# Patient Record
Sex: Female | Born: 1960 | Race: White | Hispanic: No | Marital: Married | Smoking: Current every day smoker
Health system: Southern US, Community
[De-identification: ages and names within clinical notes are randomized; demographics above are authoritative.]

## PROBLEM LIST (undated history)

## (undated) DIAGNOSIS — Z923 Personal history of irradiation: Secondary | ICD-10-CM

## (undated) DIAGNOSIS — J449 Chronic obstructive pulmonary disease, unspecified: Secondary | ICD-10-CM

## (undated) DIAGNOSIS — T7840XA Allergy, unspecified, initial encounter: Secondary | ICD-10-CM

## (undated) HISTORY — DX: Allergy, unspecified, initial encounter: T78.40XA

---

## 2002-06-28 ENCOUNTER — Ambulatory Visit (HOSPITAL_COMMUNITY): Admission: RE | Admit: 2002-06-28 | Discharge: 2002-06-28 | Payer: Self-pay | Admitting: Family Medicine

## 2002-08-02 ENCOUNTER — Other Ambulatory Visit: Admission: RE | Admit: 2002-08-02 | Discharge: 2002-08-02 | Payer: Self-pay

## 2009-01-24 ENCOUNTER — Ambulatory Visit: Payer: Self-pay | Admitting: Cardiology

## 2013-05-27 ENCOUNTER — Emergency Department (HOSPITAL_COMMUNITY)
Admission: EM | Admit: 2013-05-27 | Discharge: 2013-05-27 | Disposition: A | Discharge status: Home / Self Care | Payer: Self-pay | Attending: Emergency Medicine | Admitting: Emergency Medicine

## 2013-05-27 ENCOUNTER — Encounter (HOSPITAL_COMMUNITY): Payer: Self-pay | Admitting: Emergency Medicine

## 2013-05-27 ENCOUNTER — Emergency Department (HOSPITAL_COMMUNITY): Payer: Self-pay

## 2013-05-27 DIAGNOSIS — Z7982 Long term (current) use of aspirin: Secondary | ICD-10-CM | POA: Insufficient documentation

## 2013-05-27 DIAGNOSIS — J4489 Other specified chronic obstructive pulmonary disease: Secondary | ICD-10-CM | POA: Insufficient documentation

## 2013-05-27 DIAGNOSIS — M79671 Pain in right foot: Secondary | ICD-10-CM

## 2013-05-27 DIAGNOSIS — M25579 Pain in unspecified ankle and joints of unspecified foot: Secondary | ICD-10-CM | POA: Insufficient documentation

## 2013-05-27 DIAGNOSIS — Z79899 Other long term (current) drug therapy: Secondary | ICD-10-CM | POA: Insufficient documentation

## 2013-05-27 DIAGNOSIS — F17200 Nicotine dependence, unspecified, uncomplicated: Secondary | ICD-10-CM | POA: Insufficient documentation

## 2013-05-27 DIAGNOSIS — J449 Chronic obstructive pulmonary disease, unspecified: Secondary | ICD-10-CM | POA: Insufficient documentation

## 2013-05-27 HISTORY — DX: Chronic obstructive pulmonary disease, unspecified: J44.9

## 2013-05-27 MED ORDER — NAPROXEN 250 MG PO TABS: 250.0000 mg | ORAL_TABLET | Freq: Two times a day (BID) | ORAL | Status: DC

## 2013-05-27 NOTE — Discharge Instructions (Signed)
°Emergency Department Resource Guide °1) Find a Doctor and Pay Out of Pocket °Although you won't have to find out who is covered by your insurance plan, it is a good idea to ask around and get recommendations. You will then need to call the office and see if the doctor you have chosen will accept you as a new patient and what types of options they offer for patients who are self-pay. Some doctors offer discounts or will set up payment plans for their patients who do not have insurance, but you will need to ask so you aren't surprised when you get to your appointment. ° °2) Contact Your Local Health Department °Not all health departments have doctors that can see patients for sick visits, but many do, so it is worth a call to see if yours does. If you don't know where your local health department is, you can check in your phone book. The CDC also has a tool to help you locate your state's health department, and many state websites also have listings of all of their local health departments. ° °3) Find a Walk-in Clinic °If your illness is not likely to be very severe or complicated, you may want to try a walk in clinic. These are popping up all over the country in pharmacies, drugstores, and shopping centers. They're usually staffed by nurse practitioners or physician assistants that have been trained to treat common illnesses and complaints. They're usually fairly quick and inexpensive. However, if you have serious medical issues or chronic medical problems, these are probably not your best option. ° °No Primary Care Doctor: °- Call [facility] at  832-8000 - they can help you locate a primary care doctor that  accepts your insurance, provides certain services, etc. °- Physician Referral Service- 1-800-533-3463 ° °Chronic Pain Problems: °Organization         Address  Phone   Notes  °[facility]  ([phone]) [phone] Patients need to be referred by their primary care doctor.  ° °Medication  Assistance: °Organization         Address  Phone   Notes  °[place] [facility] [address] °[place], [place] [address] (336) 641-8030 --Must be a resident of [place] °-- Must have NO insurance coverage whatsoever (no Medicaid/ Medicare, etc.) °-- The pt. MUST have a primary care doctor that directs their care regularly and follows them in the community °  °[facility]  (866) 331-1348   °[employer]  (888) 892-1162   ° °Agencies that provide inexpensive medical care: °Organization         Address  Phone   Notes  °[facility]  (336) 832-8035   °[facility]    (336) 832-7272   °[facility] [address] °[place], [place] [address] (336) 832-4777   °[facility] [address], °[place] (336) 271-4999   °[facility]    (336) 373-0678   °[facility]    (336) 272-1050   °[facility] ° [address], [place] Phone:  (336) 832-4444, Fax:  (336) 832-4440 Hours of Operation:  9 am - 6 pm, M-F.  Also accepts Medicaid/Medicare and self-pay.  °[facility] ° [address], [place] Phone: (336) 832-3150, Fax: (336) [phone]. Hours of Operation:  8:30 am - 5:30 pm, M-F.  Also accepts Medicaid and self-pay.  °[facility] [place] [address], [place] Phone: (336) 878-6027   °[facility] [address], [place], [place] (336)723-1848, Ext. 123 Mondays & Thursdays: 7-9 AM.  First 15 patients are seen on a first come, first serve basis. °  ° °Medicaid-accepting [place] Providers: ° °Organization         Address  Phone   Notes  °[facility] [address], [place] (336) 641-2100 Also accepts self-pay patients.  °[facility] [address], [place] ° (336) 856-9996   °[facility] [address], [place]  (336) 288-8857   °[facility] [address] [place] [address], [place] (336) 299-7000   °Veita Bland [address], [place]  ° (336) 373-1557 Only accepts [place] Access Medicaid patients after they have their name applied to their card.  ° °Self-Pay (no insurance) in [place]: ° °Organization         Address  Phone   Notes  °Sickle Cell Patients, [facility] [address], [place] (336) 832-1970   °[facility] [address], [place] (336) 832-4400   °[facility] ° [address], [place] (336) 992-4800   °[facility]/Dr. Osei-Bonsu ° [address] [place] [address], [place] or [address], [place] (336) 841-8500 Phone number for both [place] and [place] locations is the same.  °[facility] [address], [place] (336) 299-0000   °[facility] [address] [place] [address], [place] or [address] (336) 852-7530 °(336) 878-2260   °[facility] [address], [place] (336) 350-1642, phone; (336) 294-5005, fax Sees patients 1st and 3rd Saturday of every month.  Must not qualify for public or private insurance (i.e. Medicaid, Medicare, [place] Health Choice, Veterans' Benefits) • Household income should be no more than 200% of the poverty level •The clinic cannot treat you if you are pregnant or think you are pregnant • Sexually transmitted diseases are not treated at the clinic.  ° ° °Dental Care: °Organization         Address  Phone  Notes  °[place] [facility] [address], [place] (336) 641-6152 Accepts children up to age 21 who are enrolled in Medicaid or [place] Health Choice; pregnant women with a Medicaid card; and children who have applied for Medicaid or [place] Health Choice, but were declined, whose parents can pay a reduced fee at time of service.  °[place]  [facility] [place]  [address], [place] (336) 641-7733 Accepts children up to age 21 who are enrolled in Medicaid or [place] Health Choice; pregnant women with a Medicaid card; and children who have applied for Medicaid or [place] Health Choice, but were declined, whose parents can pay a reduced fee at time of service.  °[facility] ° [address], [place] (336) 641-4533 Patients are seen by appointment only. Walk-ins are not accepted. [facility] will see patients 18 years of age and older. °Monday - Tuesday (8am-5pm) °Most Wednesdays (8:30-5pm) °$30 per visit, cash only  °[facility] ° [address], [place] (336) 641-4533 Patients are seen by appointment only. Walk-ins are not accepted. [facility] will see patients 18 years of age and older. °One   Wednesday Evening (Monthly: Volunteer Based).  $30 per visit, cash only  °[facility]  (919) 537-3737 for adults; Children under age 4, call Graduate Pediatric Dentistry at ([phone]) [phone]. Children aged 4-14, please call ([phone]) [phone] to request a pediatric application. ° Dental services are provided in all areas of dental care including fillings, crowns and bridges, complete and partial dentures, implants, gum treatment, root canals, and extractions. Preventive care is also provided. Treatment is provided to both adults and children. °Patients are selected via a lottery and there is often a waiting list. °  °[facility] [address], °[place] ° (336) 763-8833 [email or web address] °  °[facility] [address], [place], [place] (336)723-1848, Ext. 123 Second and Fourth Thursday of each month, opens at 6:30 AM; Clinic ends at 9 AM.  Patients are seen on a first-come first-served basis, and a limited number are seen during each clinic.  ° °[facility] ° [address], [place], [place] (336) 723-7904    Eligibility Requirements °You must have lived in [place], [place], or [place] counties for at least the last three months. °  You cannot be eligible for state or federal sponsored healthcare insurance, including Veterans Administration, Medicaid, or Medicare. °  You generally cannot be eligible for healthcare insurance through your employer.  °  How to apply: °Eligibility screenings are held every Tuesday and Wednesday afternoon from 1:00 pm until 4:00 pm. You do not need an appointment for the interview!  °[facility] [address], [place], [place] 336-631-2330   °[facility]  336-342-8273   °[facility]  336-703-3100   °[facility]  336-570-6415   ° °Behavioral Health Resources in the Community: °Intensive Outpatient Programs °Organization         Address  Phone  Notes  °[facility] [address], [place], [place] 336-878-6098   °[facility] [address], [place], [place] 336-832-9800   °[facility] [address], [place], [place] ° 336-882-2125   °[facility] [address],  °[place], [place] 1-800-853-5163 or 336-641-4981   °Substance Abuse Resources °Organization         Address  Phone  Notes  °[facility]  336-882-2125   °[facility]  336-784-9470   °[facility]  336-285-9073   °[facility]  336-845-3988   °Residential & Outpatient Substance Abuse Program  1-800-659-3381   °Psychological Services °Organization         Address  Phone  Notes  °[facility]  336- 832-9600   °[facility]  336- 378-7881   °[facility] [address], [place] 1-800-853-5163 or 336-641-4981   ° °Mobile Crisis Teams °Organization         Address  Phone  Notes  °[facility], Mobile Crisis Care Unit  1-877-626-1772   °Assertive °[facility] ° [address]  [place], [place] 336-834-9664   °Sharon DeEsch [address] °[place] 336-554-5454   ° °Self-Help/Support Groups °Organization         Address  Phone             Notes  °[facility]  - variety of support groups  336- 373-1402 Call for more information  °Narcotics Anonymous (NA), [facility] [address], °[place]  2 meetings at this location  ° °  Residential Treatment Programs Organization         Address  Phone  Notes  [facility] [address],    [place]  [phone]   [facility]  [address], [place] [phone], [place], [place] [phone]   [facility] [address], [place] [phone] Admissions: 8am-3pm M-F  [facility] [address]    [place], [place] [phone]) [phone]   [facility] [address], [place], [place] [phone]   [facility] [address]  [place], [place] [phone]) [phone]   [facility] Intensive Outpatient [address], [place], [place] [phone]   [facility] ([facility] [address]  [place], [place] [phone] or [phone]   [facility] ([facility]) [address] [place], [place] [phone] Accepts Medicaid  [facility] [address]  [place] [phone] Substance Abuse/Addiction Treatment   [facility]         Address  Phone  Notes  [facility]  [phone](888) (604)[phone]   Angie FavaJulie Brannon, PhD [address], [address] [place], [place]   [phone](336) ([phone] or [phone](336) ([phone]   [facility]   [address] [place], [place] ([phone](336) ([phone]   [facility] [address], [place], [place] [phone](336) [phone] Insurance/Medicaid/sponsorship through [facility] [address]                                    [place], [place] [phone](336) [phone] Therapy/tele-psych/case    [facility] [address]   [place], [place] [phone]) [phone]    Dr. Lolly MustacheArfeen  [phone](336) [phone]   [facility]  [employer] [facility] 1) [address], [place] 2) [address], [place] 3)  [address], [place] [phone](336) ([phone](336) ([phone]  [phone](336) [phone]   [facility] Child Abuse Hotline [phone](336) [phone] or [phone](336) [phone] (After Hours)       Take the prescription as directed.  Wear well-supportive shoes, with soft cushions or insoles for the next several months.  Frequently perform the stretching exercises, as demonstrated to you in the Emergency Department.  Apply moist heat or ice to the area(s) of discomfort, for 15 minutes at a time, several times per day for the next few days.  Do not fall asleep on a heating or ice pack.  Call your regular medical doctor and the Podatrist today to schedule a follow up appointment within the next week.  Return to the Emergency Department immediately if worsening.

## 2013-05-27 NOTE — ED Notes (Signed)
Pt c/o right foot pain x 1 month with recent swelling.

## 2013-05-27 NOTE — ED Provider Notes (Signed)
CSN: [id number]     Arrival date & time 05/27/13  7846 History   First MD Initiated Contact with Patient 05/27/13 [phone]     Chief Complaint  Patient presents with  . Foot Pain    HPI Pt was seen at 0810. Per pt, c/o gradual onset and persistence of constant right foot "pain" for the past 1 month. Pt states the pain "starts in my toes and goes up to my ankle." States the pain worsens when she stands on her feet at work. Associated with intermittent right lateral ankle and foot "swelling." Denies direct injury. Denies fevers, no focal motor weakness, no tingling/numbness in extremities, no back pain, no abd pain, no rash.    Past Medical History  Diagnosis Date  . COPD (chronic obstructive pulmonary disease)    History reviewed. No pertinent past surgical history.  History  Substance Use Topics  . Smoking status: Current Every Day Smoker -- 1.00 packs/day  . Smokeless tobacco: Not on file  . Alcohol Use: No    Review of Systems ROS: Statement: All systems negative except as marked or noted in the HPI; Constitutional: Negative for fever and chills. ; ; Eyes: Negative for eye pain, redness and discharge. ; ; ENMT: Negative for ear pain, hoarseness, nasal congestion, sinus pressure and sore throat. ; ; Cardiovascular: Negative for chest pain, palpitations, diaphoresis, dyspnea and peripheral edema. ; ; Respiratory: Negative for cough, wheezing and stridor. ; ; Gastrointestinal: Negative for nausea, vomiting, diarrhea, abdominal pain, blood in stool, hematemesis, jaundice and rectal bleeding. . ; ; Genitourinary: Negative for dysuria, flank pain and hematuria. ; ; Musculoskeletal: +right foot pain. Negative for back pain and neck pain. Negative for swelling and trauma.; ; Skin: Negative for pruritus, rash, abrasions, blisters, bruising and skin lesion.; ; Neuro: Negative for headache, lightheadedness and neck stiffness. Negative for weakness, altered level of consciousness , altered mental status,  extremity weakness, paresthesias, involuntary movement, seizure and syncope.       Allergies  Review of patient's allergies indicates no known allergies.  Home Medications   Current Outpatient Rx  Name  Route  Sig  Dispense  Refill  . aspirin EC 81 MG tablet   Oral   Take 81 mg by mouth daily.         . Multiple Vitamins-Minerals (MULTIVITAMINS THER. W/MINERALS) TABS tablet   Oral   Take 1 tablet by mouth daily.         . Omega-3 Fatty Acids (FISH OIL PO)   Oral   Take 1 capsule by mouth daily.         . naproxen (NAPROSYN) 250 MG tablet   Oral   Take 1 tablet (250 mg total) by mouth 2 (two) times daily with a meal.   14 tablet   0    BP 128/84  Pulse 79  Temp(Src) 97.9 F (36.6 C)  Resp 20  Ht 5\' 2"  (1.575 m)  Wt 170 lb (77.111 kg)  BMI 31.09 kg/m2  SpO2 96% Physical Exam 0815: Physical examination:  Nursing notes reviewed; Vital signs and O2 SAT reviewed;  Constitutional: Well developed, Well nourished, Well hydrated, In no acute distress; Head:  Normocephalic, atraumatic; Eyes: EOMI, PERRL, No scleral icterus; ENMT: Mouth and pharynx normal, Mucous membranes moist; Neck: Supple, Full range of motion, No lymphadenopathy; Cardiovascular: Regular rate and rhythm, No murmur, rub, or gallop; Respiratory: Breath sounds clear & equal bilaterally, No rales, rhonchi, wheezes.  Speaking full sentences with ease, Normal respiratory effort/excursion;  Chest: Nontender, Movement normal; Abdomen: Soft, Nontender, Nondistended, Normal bowel sounds; Extremities: Pulses normal, No deformity. No tenderness, No edema, No calf edema or asymmetry.  NMS intact right foot, strong pedal pp, LE muscle compartments soft.  No right proximal fibular head tenderness, no knee tenderness, no ankle tenderness, no foot tenderness.  No deformity, no edema, no ecchymosis, no open wounds, no erythema.  +plantarflexion of right foot w/calf squeeze.  No palpable gap right Achilles's tendon.; Neuro:  AA&Ox3, Major CN grossly intact.  Speech clear. No gross focal motor or sensory deficits in extremities. Climbs on and off stretcher easily by herself. Gait steady.; Skin: Color normal, Warm, Dry.   ED Course  Procedures   EKG Interpretation   None       MDM  MDM Reviewed: previous chart, nursing note and vitals Interpretation: x-ray   Dg Ankle Complete Right 05/27/2013   CLINICAL DATA:  Pain.  EXAM: RIGHT ANKLE - COMPLETE 3+ VIEW  COMPARISON:  None.  FINDINGS: There is no evidence of fracture, dislocation, or joint effusion. There is no evidence of arthropathy or other focal bone abnormality. Soft tissues are unremarkable.  IMPRESSION: Negative.   Electronically Signed   By: Maisie Fushomas  Register   On: 05/27/2013 08:46   Dg Foot Complete Right 05/27/2013   CLINICAL DATA:  Foot pain.  EXAM: RIGHT FOOT COMPLETE - 3+ VIEW  COMPARISON:  None.  FINDINGS: There is no evidence of fracture or dislocation. There is no evidence of arthropathy or other focal bone abnormality. Soft tissues are unremarkable.  IMPRESSION: Negative.   Electronically Signed   By: Maisie Fushomas  Register   On: 05/27/2013 08:45     [id number]:  No acute findings on XR. Will tx symptomatically. Dx and testing d/w pt and family.  Questions answered.  Verb understanding, agreeable to d/c home with outpt f/u.    Laray AngerKathleen M Emerie Vanderkolk, DO 05/29/13 1332

## 2015-02-19 IMAGING — CR DG FOOT COMPLETE 3+V*R*
3 series · 3 of 3 positions shown · non-contrast
Comparison: None.

CLINICAL DATA: Foot pain.

EXAM:
RIGHT FOOT COMPLETE - 3+ VIEW

[view not recorded (1 of 3)]
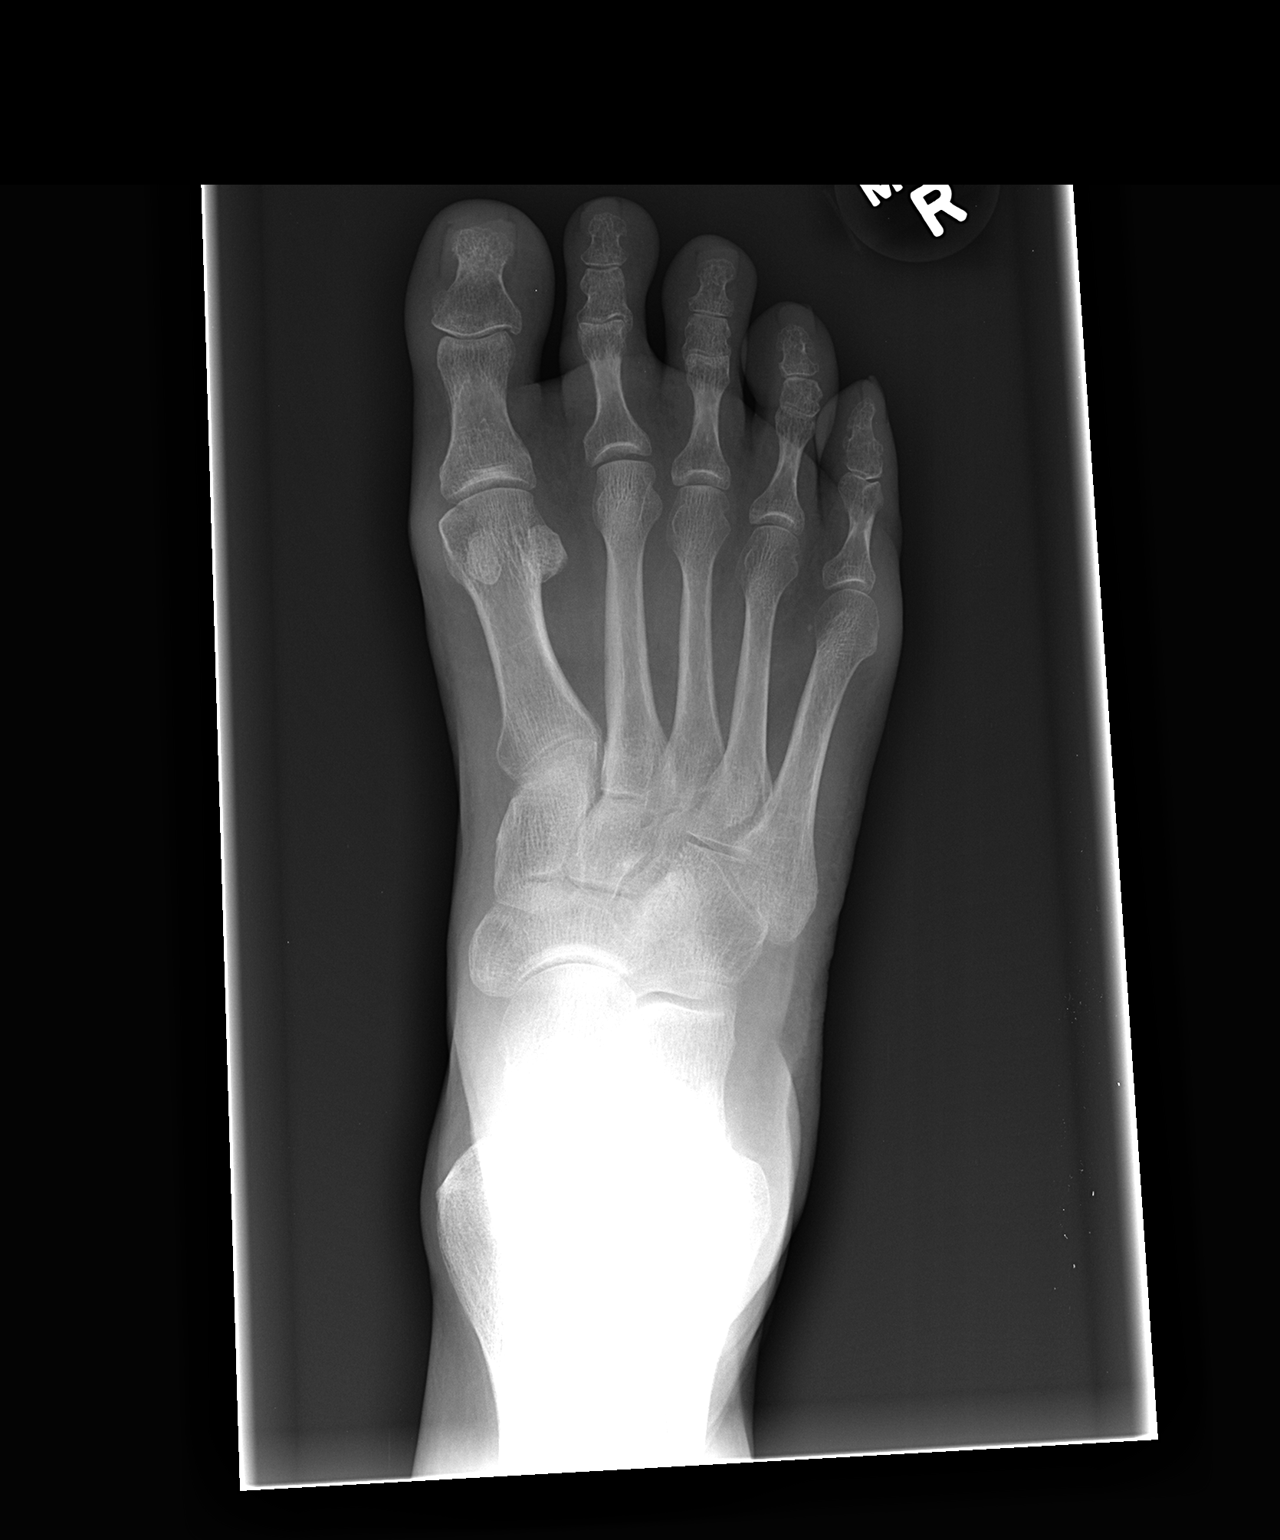

[view not recorded (2 of 3)]
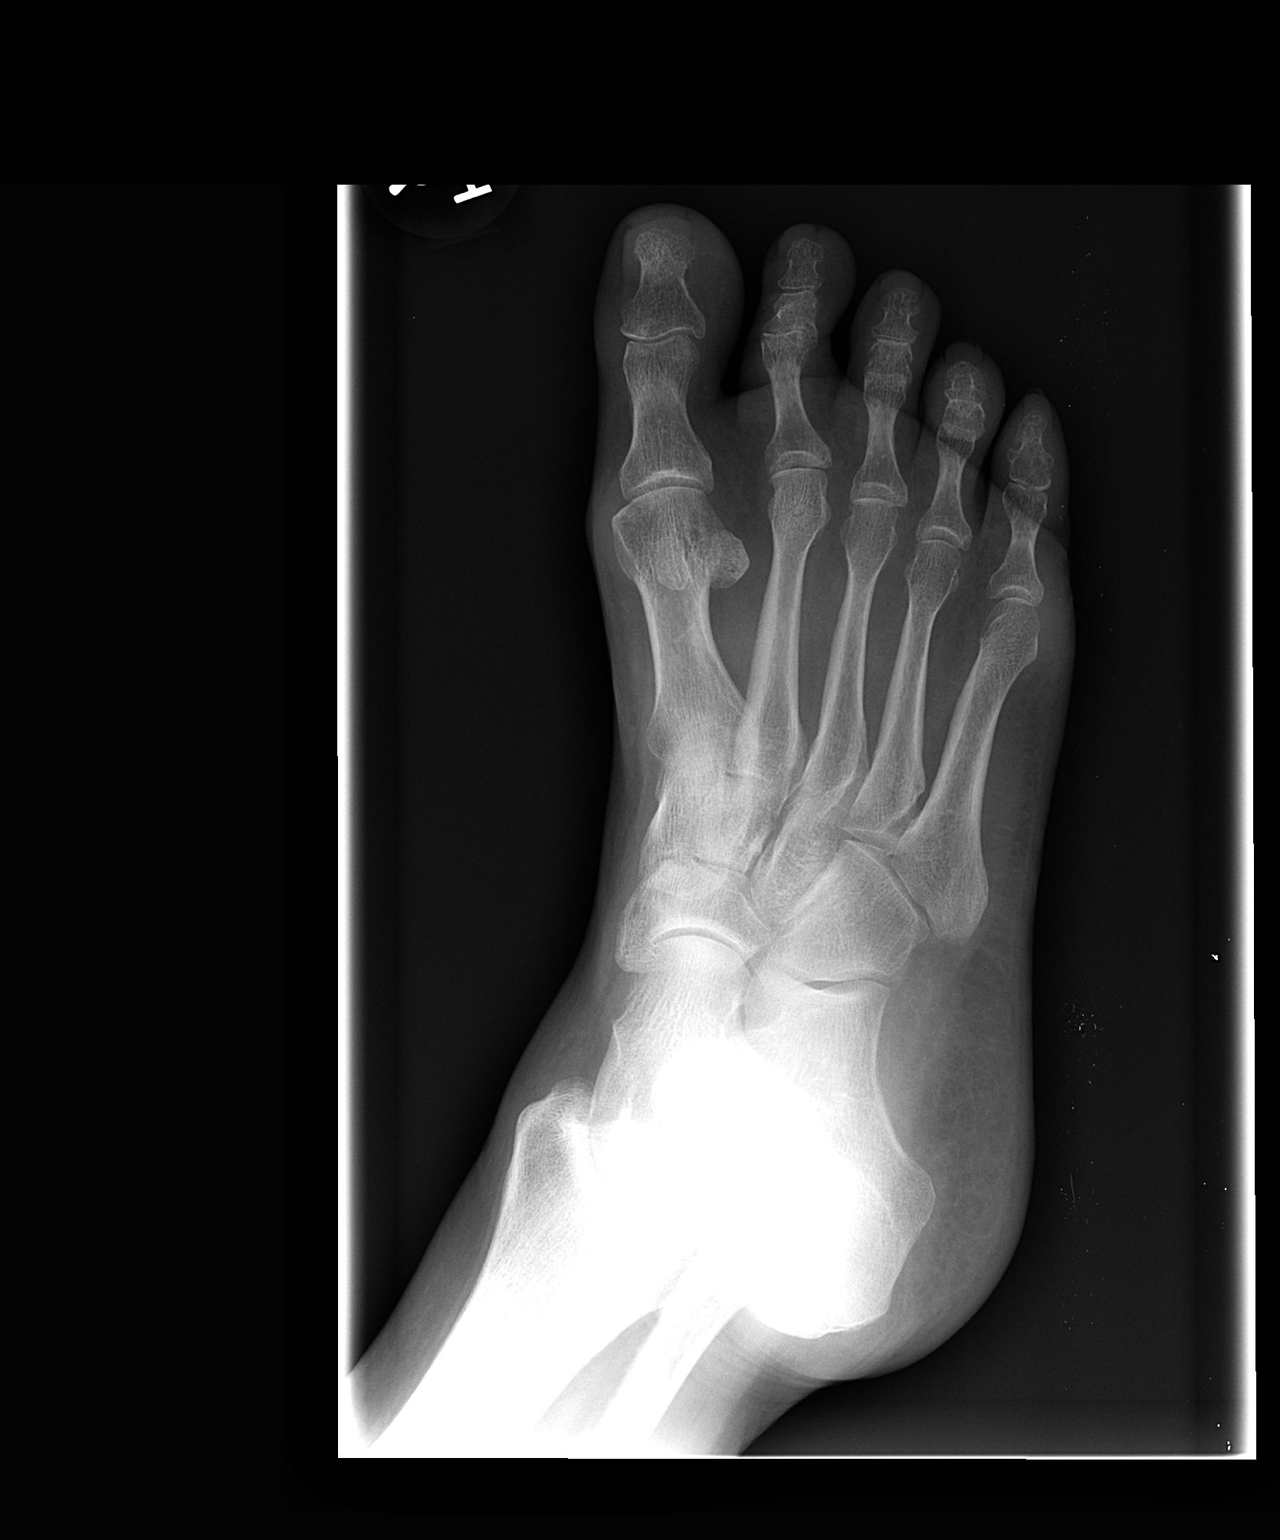

[view not recorded (3 of 3)]
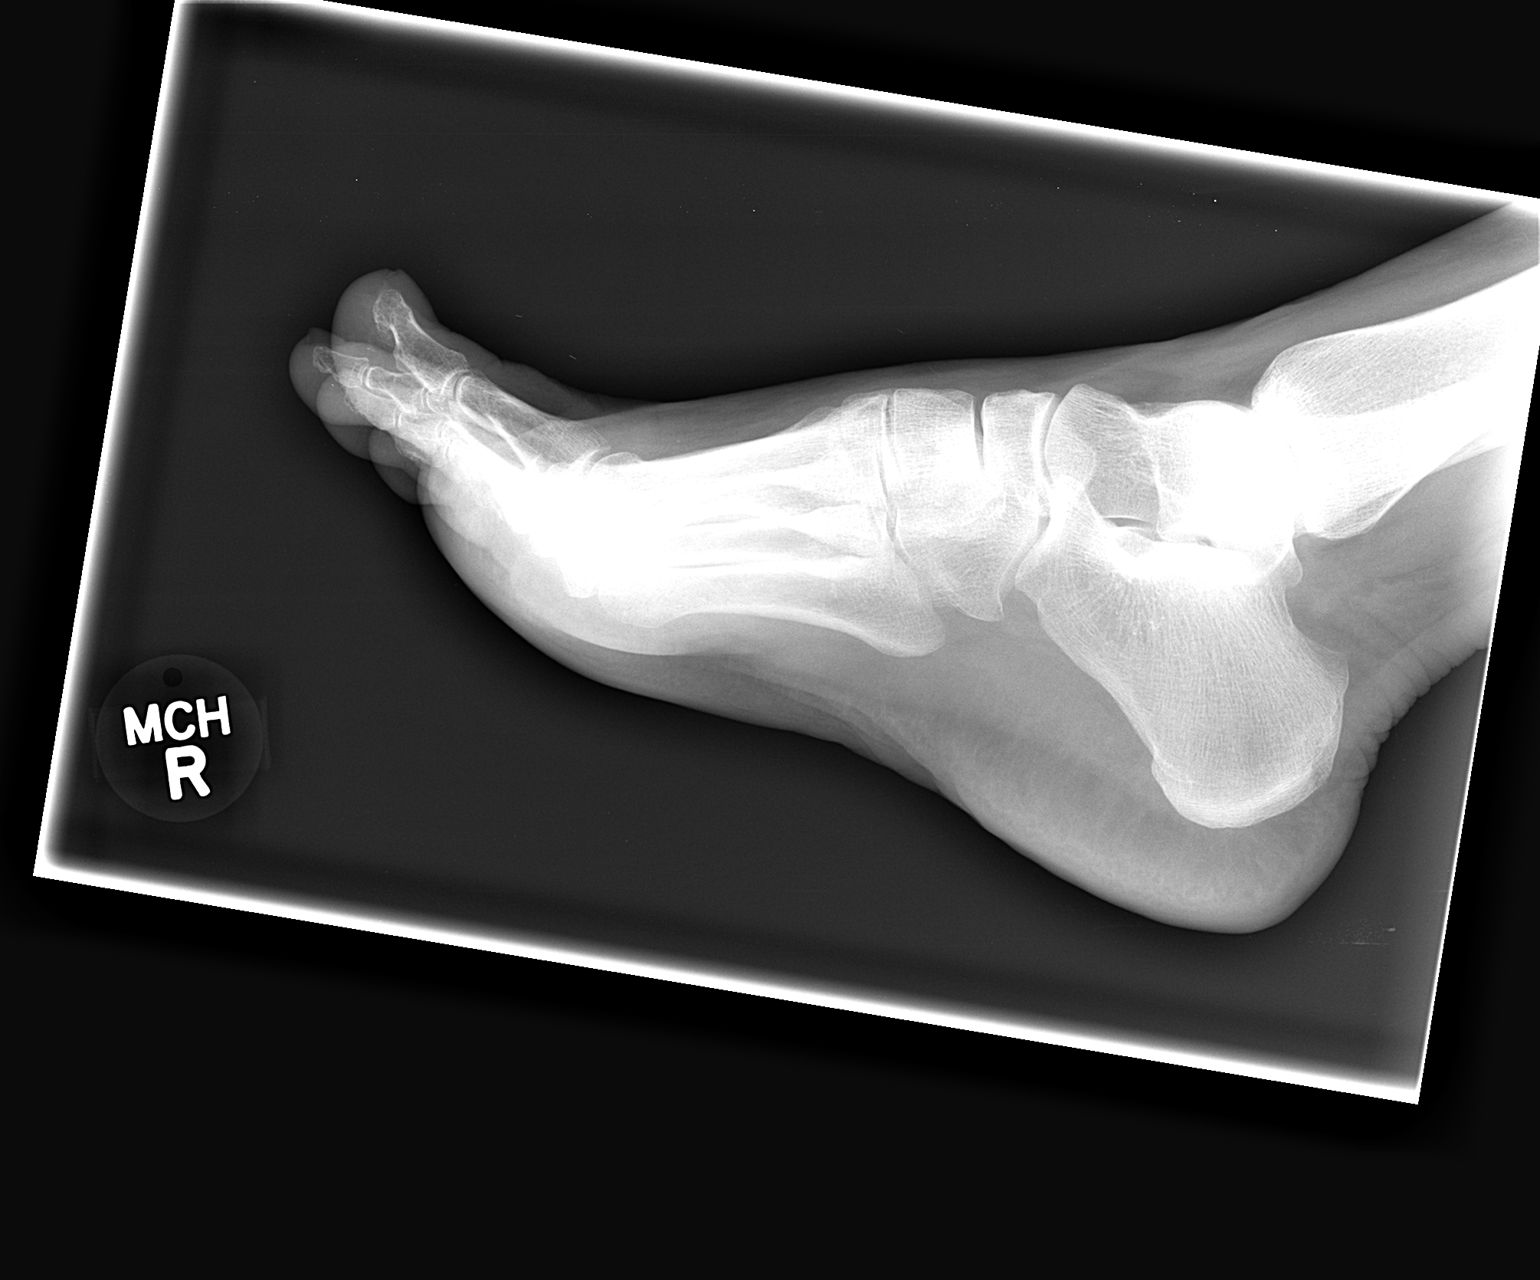

[3 of 3 positions shown; findings below may reference images not displayed]

FINDINGS: There is no evidence of fracture or dislocation. There is no
evidence of arthropathy or other focal bone abnormality. Soft
tissues are unremarkable.
IMPRESSION: Negative.

## 2015-05-06 HISTORY — PX: KNEE SURGERY: SHX244

## 2022-09-03 DIAGNOSIS — R911 Solitary pulmonary nodule: Secondary | ICD-10-CM

## 2022-09-03 HISTORY — DX: Solitary pulmonary nodule: R91.1

## 2023-01-14 NOTE — Progress Notes (Signed)
Clearence Cheek, female    DOB: 04/05/1961    MRN: [id number]   Brief patient profile:  35   yowf  active smoker with chronic cough   referred to pulmonary clinic in [place]  01/15/2023 by UC  for ? Lung mass discovered p syncope episode in [place] in May    Onset of rhinitis/ cough /wheeze age  29s    History of Present Illness  01/15/2023  Pulmonary/ 1st office eval/ Tarra Pence / [facility] on as needed albuterol has both Acupuncturist Complaint  Patient presents with   New Patient (Initial Visit)  Dyspnea:  Not limited by breathing from desired activities   Cough: slt rattle / sometimes yellow  Sleep: bed is flat/ 2 pillows / nose strips  SABA use: twice a week at most  02: none   No obvious day to day or daytime pattern/variability or assoc excess/ purulent sputum or mucus plugs or hemoptysis or cp or chest tightness, subjective wheeze or overt sinus or hb symptoms.    Also denies any obvious fluctuation of symptoms with weather or environmental changes or other aggravating or alleviating factors except as outlined above   No unusual exposure hx or h/o childhood pna/ asthma or knowledge of premature birth.  Current Allergies, Complete Past Medical History, Past Surgical History, Family History, and Social History were reviewed in [employer] record.  ROS  The following are not active complaints unless bolded Hoarseness, sore throat, dysphagia, dental problems, itching, sneezing,  nasal congestion or discharge of excess mucus or purulent secretions, ear ache,   fever, chills, sweats, unintended wt loss or wt gain, classically pleuritic or exertional cp,  orthopnea pnd or arm/hand swelling  or leg swelling, presyncope, palpitations, abdominal pain, anorexia, nausea, vomiting, diarrhea  or change in bowel habits or change in bladder habits, change in stools or change in urine, dysuria, hematuria,  rash, arthralgias, visual complaints, headache,  numbness, weakness or ataxia or problems with walking or coordination,  change in mood or  memory.              Outpatient Medications Prior to Visit  Medication Sig Dispense Refill   albuterol (ACCUNEB) 0.63 MG/3ML nebulizer solution Take 1 ampule by nebulization every 6 (six) hours as needed for wheezing.     albuterol (VENTOLIN HFA) 108 (90 Base) MCG/ACT inhaler Inhale into the lungs every 6 (six) hours as needed for wheezing or shortness of breath. Using at night     aspirin EC 81 MG tablet Take 81 mg by mouth daily.     Multiple Vitamins-Minerals (MULTIVITAMINS THER. W/MINERALS) TABS tablet Take 1 tablet by mouth daily.     naproxen (NAPROSYN) 250 MG tablet Take 1 tablet (250 mg total) by mouth 2 (two) times daily with a meal. 14 tablet 0   Omega-3 Fatty Acids (FISH OIL PO) Take 1 capsule by mouth daily.     No facility-administered medications prior to visit.    Past Medical History:  Diagnosis Date   COPD (chronic obstructive pulmonary disease) (HCC)       Objective:     BP 127/82   Pulse 77   Ht 5\' 2"  (1.575 m)   Wt 170 lb 3.2 oz (77.2 kg)   SpO2 95% Comment: RA  BMI 31.13 kg/m   SpO2: 95 % (RA)  Pleasant amb wf nad    HEENT : Oropharynx  clear      Nasal turbinates nl  NECK :  without  apparent JVD/ palpable Nodes/TM    LUNGS: no acc muscle use,  Nl contour chest  Minimal insp exp rhonchi / mostly upper airway  CV:  RRR  no s3 or murmur or increase in P2, and no edema   ABD:  soft and nontender with nl inspiratory excursion in the supine position. No bruits or organomegaly appreciated   MS:  Nl gait/ ext warm without deformities Or obvious joint restrictions  calf tenderness, cyanosis or clubbing    SKIN: warm and dry without lesions    NEURO:  alert, approp, nl sensorium with  no motor or cerebellar deficits apparent.      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   Will proceed with just the pet to spare her the cost of an unneeded CT chest.   Discussed in detail all the  indications, usual  risks and alternatives  relative to the benefits with patient who agrees to proceed with w/u as outlined.                   Cigarette smoker Counseled re importance of smoking cessation but did not meet time criteria for separate billing      Each maintenance medication was reviewed in detail including emphasizing most importantly the difference between maintenance and prns and under what circumstances the prns are to be triggered using an action plan format where appropriate.  Total time for H and P, chart review, counseling, reviewing hfa device(s) and generating customized AVS unique to this office visit / same day charting = 30 min new pt eval          Sandrea Hughs, MD 01/15/2023

## 2023-01-15 ENCOUNTER — Ambulatory Visit (INDEPENDENT_AMBULATORY_CARE_PROVIDER_SITE_OTHER): Payer: Self-pay | Admitting: Internal Medicine

## 2023-01-15 ENCOUNTER — Encounter: Payer: Self-pay | Admitting: Internal Medicine

## 2023-01-15 VITALS — BP 127/82 | HR 77 | Ht 62.0 in | Wt 170.2 lb

## 2023-01-15 DIAGNOSIS — R911 Solitary pulmonary nodule: Secondary | ICD-10-CM | POA: Insufficient documentation

## 2023-01-15 DIAGNOSIS — F17210 Nicotine dependence, cigarettes, uncomplicated: Secondary | ICD-10-CM

## 2023-01-15 DIAGNOSIS — R918 Other nonspecific abnormal finding of lung field: Secondary | ICD-10-CM

## 2023-01-15 NOTE — Assessment & Plan Note (Signed)
Counseled re importance of smoking cessation but did not meet time criteria for separate billing      Each maintenance medication was reviewed in detail including emphasizing most importantly the difference between maintenance and prns and under what circumstances the prns are to be triggered using an action plan format where appropriate.  Total time for H and P, chart review, counseling, reviewing hfa device(s) and generating customized AVS unique to this office visit / same day charting = 30 min new pt eval

## 2023-01-15 NOTE — Patient Instructions (Addendum)
The key is to stop smoking completely before smoking completely stops you!  Sign up for Obama care insurance as soon as possible     My office will be contacting you by phone for referral to PET Scan at [facility] next available   - if you don't hear back from my office within one week please call us back or notify us thru MyChart and we'll address it right away.    Please remember to go to the  x-ray department  @  [facility] for your tests - we will call you with the results when they are available      No follow up needed at this office  but we can co-ordinate your care from here by phone/ computer

## 2023-01-15 NOTE — Assessment & Plan Note (Addendum)
Active smoker/ seen on incidental portable 09/27/22 @ 2.8 cm diam -   Unfortunately given her smoking hx the size of this asymptomatic nodule (no infection hx) this is most likely bronchogenic ca / staging best done by PET  - since she has no insurance advised she apply for obamacare immediately and [place] if [place] in .   Will proceed with just the pet to spare her the cost of an unneeded CT chest.   Discussed in detail all the  indications, usual  risks and alternatives  relative to the benefits with patient who agrees to proceed with w/u as outlined.

## 2023-01-16 ENCOUNTER — Encounter: Payer: Self-pay | Admitting: Internal Medicine

## 2023-01-29 ENCOUNTER — Encounter (HOSPITAL_COMMUNITY)
Admission: RE | Admit: 2023-01-29 | Discharge: 2023-01-29 | Disposition: A | Discharge status: Home / Self Care | Payer: Self-pay | Source: Ambulatory Visit | Attending: Internal Medicine | Admitting: Internal Medicine

## 2023-01-29 DIAGNOSIS — R918 Other nonspecific abnormal finding of lung field: Secondary | ICD-10-CM | POA: Insufficient documentation

## 2023-02-03 DIAGNOSIS — C801 Malignant (primary) neoplasm, unspecified: Secondary | ICD-10-CM

## 2023-02-03 HISTORY — DX: Malignant (primary) neoplasm, unspecified: C80.1

## 2023-02-05 ENCOUNTER — Encounter (HOSPITAL_COMMUNITY)
Admission: RE | Admit: 2023-02-05 | Discharge: 2023-02-05 | Disposition: A | Discharge status: Home / Self Care | Payer: Self-pay | Source: Ambulatory Visit | Attending: Internal Medicine | Admitting: Internal Medicine

## 2023-02-05 DIAGNOSIS — R918 Other nonspecific abnormal finding of lung field: Secondary | ICD-10-CM | POA: Insufficient documentation

## 2023-02-05 MED ORDER — FLUDEOXYGLUCOSE F - 18 (FDG) INJECTION
8.3500 | Freq: Once | INTRAVENOUS | Status: AC | PRN
  Administered 2023-02-05: 8.35 via INTRAVENOUS

## 2023-02-20 ENCOUNTER — Encounter: Payer: Self-pay | Admitting: Pulmonary Disease

## 2023-02-20 ENCOUNTER — Ambulatory Visit: Payer: Self-pay | Admitting: Pulmonary Disease

## 2023-02-20 VITALS — BP 136/74 | HR 76 | Temp 98.1°F | Ht 63.0 in | Wt 169.2 lb

## 2023-02-20 DIAGNOSIS — R911 Solitary pulmonary nodule: Secondary | ICD-10-CM

## 2023-02-20 DIAGNOSIS — R942 Abnormal results of pulmonary function studies: Secondary | ICD-10-CM

## 2023-02-20 NOTE — Progress Notes (Signed)
Synopsis: Referred in October 2024 for abnormal CT chest abnormal PET scan by No ref. provider found  Subjective:   PATIENT ID: Debra Lutz GENDER: female DOB: 14-Oct-1960, MRN: [id number]  Chief Complaint  Patient presents with   Consult    Review PET 02/2023 and CT chest from 09/2022    This is a 62 year old female longstanding history of tobacco abuse, COPD, longstanding history of cigarettes daily smoker at least a pack per day.  She had CT imaging that was completed at [facility] health care in [place].  That found a lung nodule she was referred to pulmonary seen by Dr. Sherene Sires.Patient had a September 2024 was seen for evaluation and had a nuclear medicine PET scan that was completed.  PET scan completed on 02/05/2023 revealed a hypermetabolic left upper lobe pulmonary mass measuring 3.1 cm suspicious for bronchogenic carcinoma with associated hypermetabolic left hilar adenopathy and AP window adenopathy.  Patient was referred to see me for consideration of bronchoscopy and biopsy.    Past Medical History:  Diagnosis Date   COPD (chronic obstructive pulmonary disease) (HCC)      No family history on file.   No past surgical history on file.  Social History   Socioeconomic History   Marital status: Legally Separated    Spouse name: Not on file   Number of children: Not on file   Years of education: Not on file   Highest education level: Not on file  Occupational History   Not on file  Tobacco Use   Smoking status: Every Day    Current packs/day: 1.00    Types: Cigarettes   Smokeless tobacco: Not on file  Substance and Sexual Activity   Alcohol use: No   Drug use: Yes    Types: Marijuana   Sexual activity: Not on file  Other Topics Concern   Not on file  Social History Narrative   Not on file   Social Determinants of Health   Financial Resource Strain: Not on file  Food Insecurity: Not on file  Transportation Needs: Not on file  Physical Activity: Not on file   Stress: Not on file  Social Connections: Not on file  Intimate Partner Violence: Not on file     Allergies  Allergen Reactions   Wixela Inhub [Fluticasone-Salmeterol] Other (See Comments)    Having tightness in chest ,joint pain , loss of appetite      Outpatient Medications Prior to Visit  Medication Sig Dispense Refill   albuterol (ACCUNEB) 0.63 MG/3ML nebulizer solution Take 1 ampule by nebulization every 6 (six) hours as needed for wheezing.     albuterol (VENTOLIN HFA) 108 (90 Base) MCG/ACT inhaler Inhale into the lungs every 6 (six) hours as needed for wheezing or shortness of breath. Using at night     fexofenadine (ALLEGRA ALLERGY) 180 MG tablet Take 180 mg by mouth daily.     ibuprofen (ADVIL) 200 MG tablet Take 200 mg by mouth every 4 (four) hours as needed for mild pain (pain score 1-3).     Multiple Vitamins-Minerals (MULTIVITAMINS THER. W/MINERALS) TABS tablet Take 1 tablet by mouth daily.     aspirin EC 81 MG tablet Take 81 mg by mouth daily. (Patient not taking: Reported on 02/20/2023)     naproxen (NAPROSYN) 250 MG tablet Take 1 tablet (250 mg total) by mouth 2 (two) times daily with a meal. (Patient not taking: Reported on 02/20/2023) 14 tablet 0   Omega-3 Fatty Acids (FISH OIL PO) Take 1  capsule by mouth daily. (Patient not taking: Reported on 02/20/2023)     No facility-administered medications prior to visit.    Review of Systems  Constitutional:  Negative for chills, fever, malaise/fatigue and weight loss.  HENT:  Negative for hearing loss, sore throat and tinnitus.   Eyes:  Negative for blurred vision and double vision.  Respiratory:  Negative for cough, hemoptysis, sputum production, shortness of breath, wheezing and stridor.   Cardiovascular:  Negative for chest pain, palpitations, orthopnea, leg swelling and PND.  Gastrointestinal:  Negative for abdominal pain, constipation, diarrhea, heartburn, nausea and vomiting.  Genitourinary:  Negative for dysuria,  hematuria and urgency.  Musculoskeletal:  Negative for joint pain and myalgias.  Skin:  Negative for itching and rash.  Neurological:  Negative for dizziness, tingling, weakness and headaches.  Endo/Heme/Allergies:  Negative for environmental allergies. Does not bruise/bleed easily.  Psychiatric/Behavioral:  Negative for depression. The patient is not nervous/anxious and does not have insomnia.   All other systems reviewed and are negative.    Objective:  Physical Exam Vitals reviewed.  Constitutional:      General: She is not in acute distress.    Appearance: She is well-developed.  HENT:     Head: Normocephalic and atraumatic.  Eyes:     General: No scleral icterus.    Conjunctiva/sclera: Conjunctivae normal.     Pupils: Pupils are equal, round, and reactive to light.  Neck:     Vascular: No JVD.     Trachea: No tracheal deviation.  Cardiovascular:     Rate and Rhythm: Normal rate and regular rhythm.     Heart sounds: Normal heart sounds. No murmur heard. Pulmonary:     Effort: Pulmonary effort is normal. No tachypnea, accessory muscle usage or respiratory distress.     Breath sounds: No stridor. No wheezing, rhonchi or rales.  Abdominal:     General: There is no distension.     Palpations: Abdomen is soft.     Tenderness: There is no abdominal tenderness.  Musculoskeletal:        General: No tenderness.     Cervical back: Neck supple.  Lymphadenopathy:     Cervical: No cervical adenopathy.  Skin:    General: Skin is warm and dry.     Capillary Refill: Capillary refill takes less than 2 seconds.     Findings: No rash.  Neurological:     Mental Status: She is alert and oriented to person, place, and time.  Psychiatric:        Behavior: Behavior normal.      Vitals:   02/20/23 1338  BP: 136/74  Pulse: 76  Temp: 98.1 F (36.7 C)  TempSrc: Oral  SpO2: 99%  Weight: 169 lb 3.2 oz (76.7 kg)  Height: 5\' 3"  (1.6 m)   99% on RA BMI Readings from Last 3  Encounters:  02/20/23 29.97 kg/m  01/15/23 31.13 kg/m  05/27/13 31.09 kg/m   Wt Readings from Last 3 Encounters:  02/20/23 169 lb 3.2 oz (76.7 kg)  01/15/23 170 lb 3.2 oz (77.2 kg)  05/27/13 170 lb (77.1 kg)     CBC No results found for: "WBC", "RBC", "HGB", "HCT", "PLT", "MCV", "MCH", "MCHC", "RDW", "LYMPHSABS", "MONOABS", "EOSABS", "BASOSABS"   Chest Imaging:  Nuclear medicine pet imaging October 2024: Left upper lobe pulmonary mass 3.1 cm suspicious for bronchogenic carcinoma with associated hilar adenopathy. The patient's images have been independently reviewed by me.    Pulmonary Functions Testing Results:  No data to display          FeNO:   Pathology:   Echocardiogram:   Heart Catheterization:     Assessment & Plan:     ICD-10-CM   1. Lung nodule  R91.1 Procedural/ Surgical Case Request: ROBOTIC ASSISTED NAVIGATIONAL BRONCHOSCOPY, VIDEO BRONCHOSCOPY WITH ENDOBRONCHIAL ULTRASOUND    Ambulatory referral to Pulmonology    CT Super D Chest Wo Contrast    2. Abnormal PET of right lung  R94.2       Discussion:  This 62 year old female found to have a abnormal PET scan concerning for left upper lobe pulmonary mass with associated adenopathy concerning for primary bronchogenic carcinoma with nodal metastasis.  Plan: Today in the office we talked about risk-benefit alternatives moving forward with procedure to consider bronchoscopy and biopsy. We talked of the risk of bleeding and pneumothorax. Tentative bronchoscopy date we on 03/02/2023. Patient is agreeable to proceed.  She is not on any blood thinners antiplatelets or is diabetic.  We appreciate consult.    Current Outpatient Medications:    albuterol (ACCUNEB) 0.63 MG/3ML nebulizer solution, Take 1 ampule by nebulization every 6 (six) hours as needed for wheezing., Disp: , Rfl:    albuterol (VENTOLIN HFA) 108 (90 Base) MCG/ACT inhaler, Inhale into the lungs every 6 (six) hours as needed for  wheezing or shortness of breath. Using at night, Disp: , Rfl:    fexofenadine (ALLEGRA ALLERGY) 180 MG tablet, Take 180 mg by mouth daily., Disp: , Rfl:    ibuprofen (ADVIL) 200 MG tablet, Take 200 mg by mouth every 4 (four) hours as needed for mild pain (pain score 1-3)., Disp: , Rfl:    Multiple Vitamins-Minerals (MULTIVITAMINS THER. W/MINERALS) TABS tablet, Take 1 tablet by mouth daily., Disp: , Rfl:   Josephine Igo, DO [facility] 02/20/2023 1:59 PM

## 2023-02-20 NOTE — Patient Instructions (Signed)
Thank you for visiting Dr. Tonia Brooms at [facility]. Today we recommend the following:  Orders Placed This Encounter  Procedures   Procedural/ Surgical Case Request: ROBOTIC ASSISTED NAVIGATIONAL BRONCHOSCOPY, VIDEO BRONCHOSCOPY WITH ENDOBRONCHIAL ULTRASOUND   CT Super D Chest Wo Contrast   Ambulatory referral to Pulmonology   Return in about 17 days (around 03/09/2023) for with Kandice Robinsons, NP, after Bronchoscopy.    Please do your part to reduce the spread of COVID-19.

## 2023-02-20 NOTE — H&P (View-Only) (Signed)
Synopsis: Referred in October 2024 for abnormal CT chest abnormal PET scan by No ref. provider found  Subjective:   PATIENT ID: Debra Lutz GENDER: female DOB: 14-Oct-1960, MRN: [id number]  Chief Complaint  Patient presents with   Consult    Review PET 02/2023 and CT chest from 09/2022    This is a 62 year old female longstanding history of tobacco abuse, COPD, longstanding history of cigarettes daily smoker at least a pack per day.  She had CT imaging that was completed at [facility] health care in [place].  That found a lung nodule she was referred to pulmonary seen by Dr. Sherene Sires.Patient had a September 2024 was seen for evaluation and had a nuclear medicine PET scan that was completed.  PET scan completed on 02/05/2023 revealed a hypermetabolic left upper lobe pulmonary mass measuring 3.1 cm suspicious for bronchogenic carcinoma with associated hypermetabolic left hilar adenopathy and AP window adenopathy.  Patient was referred to see me for consideration of bronchoscopy and biopsy.    Past Medical History:  Diagnosis Date   COPD (chronic obstructive pulmonary disease) (HCC)      No family history on file.   No past surgical history on file.  Social History   Socioeconomic History   Marital status: Legally Separated    Spouse name: Not on file   Number of children: Not on file   Years of education: Not on file   Highest education level: Not on file  Occupational History   Not on file  Tobacco Use   Smoking status: Every Day    Current packs/day: 1.00    Types: Cigarettes   Smokeless tobacco: Not on file  Substance and Sexual Activity   Alcohol use: No   Drug use: Yes    Types: Marijuana   Sexual activity: Not on file  Other Topics Concern   Not on file  Social History Narrative   Not on file   Social Determinants of Health   Financial Resource Strain: Not on file  Food Insecurity: Not on file  Transportation Needs: Not on file  Physical Activity: Not on file   Stress: Not on file  Social Connections: Not on file  Intimate Partner Violence: Not on file     Allergies  Allergen Reactions   Wixela Inhub [Fluticasone-Salmeterol] Other (See Comments)    Having tightness in chest ,joint pain , loss of appetite      Outpatient Medications Prior to Visit  Medication Sig Dispense Refill   albuterol (ACCUNEB) 0.63 MG/3ML nebulizer solution Take 1 ampule by nebulization every 6 (six) hours as needed for wheezing.     albuterol (VENTOLIN HFA) 108 (90 Base) MCG/ACT inhaler Inhale into the lungs every 6 (six) hours as needed for wheezing or shortness of breath. Using at night     fexofenadine (ALLEGRA ALLERGY) 180 MG tablet Take 180 mg by mouth daily.     ibuprofen (ADVIL) 200 MG tablet Take 200 mg by mouth every 4 (four) hours as needed for mild pain (pain score 1-3).     Multiple Vitamins-Minerals (MULTIVITAMINS THER. W/MINERALS) TABS tablet Take 1 tablet by mouth daily.     aspirin EC 81 MG tablet Take 81 mg by mouth daily. (Patient not taking: Reported on 02/20/2023)     naproxen (NAPROSYN) 250 MG tablet Take 1 tablet (250 mg total) by mouth 2 (two) times daily with a meal. (Patient not taking: Reported on 02/20/2023) 14 tablet 0   Omega-3 Fatty Acids (FISH OIL PO) Take 1  capsule by mouth daily. (Patient not taking: Reported on 02/20/2023)     No facility-administered medications prior to visit.    Review of Systems  Constitutional:  Negative for chills, fever, malaise/fatigue and weight loss.  HENT:  Negative for hearing loss, sore throat and tinnitus.   Eyes:  Negative for blurred vision and double vision.  Respiratory:  Negative for cough, hemoptysis, sputum production, shortness of breath, wheezing and stridor.   Cardiovascular:  Negative for chest pain, palpitations, orthopnea, leg swelling and PND.  Gastrointestinal:  Negative for abdominal pain, constipation, diarrhea, heartburn, nausea and vomiting.  Genitourinary:  Negative for dysuria,  hematuria and urgency.  Musculoskeletal:  Negative for joint pain and myalgias.  Skin:  Negative for itching and rash.  Neurological:  Negative for dizziness, tingling, weakness and headaches.  Endo/Heme/Allergies:  Negative for environmental allergies. Does not bruise/bleed easily.  Psychiatric/Behavioral:  Negative for depression. The patient is not nervous/anxious and does not have insomnia.   All other systems reviewed and are negative.    Objective:  Physical Exam Vitals reviewed.  Constitutional:      General: She is not in acute distress.    Appearance: She is well-developed.  HENT:     Head: Normocephalic and atraumatic.  Eyes:     General: No scleral icterus.    Conjunctiva/sclera: Conjunctivae normal.     Pupils: Pupils are equal, round, and reactive to light.  Neck:     Vascular: No JVD.     Trachea: No tracheal deviation.  Cardiovascular:     Rate and Rhythm: Normal rate and regular rhythm.     Heart sounds: Normal heart sounds. No murmur heard. Pulmonary:     Effort: Pulmonary effort is normal. No tachypnea, accessory muscle usage or respiratory distress.     Breath sounds: No stridor. No wheezing, rhonchi or rales.  Abdominal:     General: There is no distension.     Palpations: Abdomen is soft.     Tenderness: There is no abdominal tenderness.  Musculoskeletal:        General: No tenderness.     Cervical back: Neck supple.  Lymphadenopathy:     Cervical: No cervical adenopathy.  Skin:    General: Skin is warm and dry.     Capillary Refill: Capillary refill takes less than 2 seconds.     Findings: No rash.  Neurological:     Mental Status: She is alert and oriented to person, place, and time.  Psychiatric:        Behavior: Behavior normal.      Vitals:   02/20/23 1338  BP: 136/74  Pulse: 76  Temp: 98.1 F (36.7 C)  TempSrc: Oral  SpO2: 99%  Weight: 169 lb 3.2 oz (76.7 kg)  Height: 5\' 3"  (1.6 m)   99% on RA BMI Readings from Last 3  Encounters:  02/20/23 29.97 kg/m  01/15/23 31.13 kg/m  05/27/13 31.09 kg/m   Wt Readings from Last 3 Encounters:  02/20/23 169 lb 3.2 oz (76.7 kg)  01/15/23 170 lb 3.2 oz (77.2 kg)  05/27/13 170 lb (77.1 kg)     CBC No results found for: "WBC", "RBC", "HGB", "HCT", "PLT", "MCV", "MCH", "MCHC", "RDW", "LYMPHSABS", "MONOABS", "EOSABS", "BASOSABS"   Chest Imaging:  Nuclear medicine pet imaging October 2024: Left upper lobe pulmonary mass 3.1 cm suspicious for bronchogenic carcinoma with associated hilar adenopathy. The patient's images have been independently reviewed by me.    Pulmonary Functions Testing Results:  No data to display          FeNO:   Pathology:   Echocardiogram:   Heart Catheterization:     Assessment & Plan:     ICD-10-CM   1. Lung nodule  R91.1 Procedural/ Surgical Case Request: ROBOTIC ASSISTED NAVIGATIONAL BRONCHOSCOPY, VIDEO BRONCHOSCOPY WITH ENDOBRONCHIAL ULTRASOUND    Ambulatory referral to Pulmonology    CT Super D Chest Wo Contrast    2. Abnormal PET of right lung  R94.2       Discussion:  This 62 year old female found to have a abnormal PET scan concerning for left upper lobe pulmonary mass with associated adenopathy concerning for primary bronchogenic carcinoma with nodal metastasis.  Plan: Today in the office we talked about risk-benefit alternatives moving forward with procedure to consider bronchoscopy and biopsy. We talked of the risk of bleeding and pneumothorax. Tentative bronchoscopy date we on 03/02/2023. Patient is agreeable to proceed.  She is not on any blood thinners antiplatelets or is diabetic.  We appreciate consult.    Current Outpatient Medications:    albuterol (ACCUNEB) 0.63 MG/3ML nebulizer solution, Take 1 ampule by nebulization every 6 (six) hours as needed for wheezing., Disp: , Rfl:    albuterol (VENTOLIN HFA) 108 (90 Base) MCG/ACT inhaler, Inhale into the lungs every 6 (six) hours as needed for  wheezing or shortness of breath. Using at night, Disp: , Rfl:    fexofenadine (ALLEGRA ALLERGY) 180 MG tablet, Take 180 mg by mouth daily., Disp: , Rfl:    ibuprofen (ADVIL) 200 MG tablet, Take 200 mg by mouth every 4 (four) hours as needed for mild pain (pain score 1-3)., Disp: , Rfl:    Multiple Vitamins-Minerals (MULTIVITAMINS THER. W/MINERALS) TABS tablet, Take 1 tablet by mouth daily., Disp: , Rfl:   Josephine Igo, DO [facility] 02/20/2023 1:59 PM

## 2023-02-27 ENCOUNTER — Encounter (HOSPITAL_COMMUNITY): Payer: Self-pay | Admitting: Pulmonary Disease

## 2023-02-27 NOTE — Progress Notes (Signed)
After several attempts to reach patient via phone, unable to connect with the patient.  Left a detailed message on machine with instructions and information for DOS.  PCP - Coral Ceo, FNP Cardiologist - none  Chest Xray - 09/27/22 NM PET  - 02/05/23  EKG - n/a Stress Test - n/a ECHO - n/a Cardiac Cath - n/a  ICD Pacemaker/Loop - n/a  Sleep Study -  n/a  Diabetes - n/a

## 2023-03-02 ENCOUNTER — Ambulatory Visit (HOSPITAL_COMMUNITY): Payer: Self-pay | Admitting: Anesthesiology

## 2023-03-02 ENCOUNTER — Encounter (HOSPITAL_COMMUNITY): Payer: Self-pay | Admitting: Pulmonary Disease

## 2023-03-02 ENCOUNTER — Ambulatory Visit (HOSPITAL_BASED_OUTPATIENT_CLINIC_OR_DEPARTMENT_OTHER): Payer: Self-pay | Admitting: Anesthesiology

## 2023-03-02 ENCOUNTER — Ambulatory Visit (HOSPITAL_COMMUNITY)
Admission: RE | Admit: 2023-03-02 | Discharge: 2023-03-02 | Disposition: A | Discharge status: Home / Self Care | Payer: Self-pay | Attending: Pulmonary Disease | Admitting: Pulmonary Disease

## 2023-03-02 ENCOUNTER — Encounter (HOSPITAL_COMMUNITY)
Admission: RE | Disposition: A | Discharge status: Home / Self Care | Payer: Self-pay | Source: Home / Self Care | Attending: Pulmonary Disease

## 2023-03-02 ENCOUNTER — Ambulatory Visit (HOSPITAL_COMMUNITY): Payer: Self-pay

## 2023-03-02 DIAGNOSIS — Z01818 Encounter for other preprocedural examination: Secondary | ICD-10-CM

## 2023-03-02 DIAGNOSIS — R942 Abnormal results of pulmonary function studies: Secondary | ICD-10-CM | POA: Insufficient documentation

## 2023-03-02 DIAGNOSIS — R911 Solitary pulmonary nodule: Secondary | ICD-10-CM

## 2023-03-02 DIAGNOSIS — J449 Chronic obstructive pulmonary disease, unspecified: Secondary | ICD-10-CM

## 2023-03-02 DIAGNOSIS — X58XXXA Exposure to other specified factors, initial encounter: Secondary | ICD-10-CM | POA: Insufficient documentation

## 2023-03-02 DIAGNOSIS — R599 Enlarged lymph nodes, unspecified: Secondary | ICD-10-CM | POA: Insufficient documentation

## 2023-03-02 DIAGNOSIS — I700 Atherosclerosis of aorta: Secondary | ICD-10-CM | POA: Insufficient documentation

## 2023-03-02 DIAGNOSIS — R846 Abnormal cytological findings in specimens from respiratory organs and thorax: Secondary | ICD-10-CM | POA: Insufficient documentation

## 2023-03-02 DIAGNOSIS — F17210 Nicotine dependence, cigarettes, uncomplicated: Secondary | ICD-10-CM | POA: Insufficient documentation

## 2023-03-02 DIAGNOSIS — S2221XA Fracture of manubrium, initial encounter for closed fracture: Secondary | ICD-10-CM | POA: Insufficient documentation

## 2023-03-02 HISTORY — PX: BRONCHIAL NEEDLE ASPIRATION BIOPSY: SHX5106

## 2023-03-02 HISTORY — PX: VIDEO BRONCHOSCOPY WITH ENDOBRONCHIAL ULTRASOUND: SHX6177

## 2023-03-02 LAB — CBC
HCT: 45 % (ref 36.0–46.0)
Hemoglobin: 15.1 g/dL — ABNORMAL HIGH (ref 12.0–15.0)
MCH: 30 pg (ref 26.0–34.0)
MCHC: 33.6 g/dL (ref 30.0–36.0)
MCV: 89.5 fL (ref 80.0–100.0)
Platelets: 284 10*3/uL (ref 150–400)
RBC: 5.03 MIL/uL (ref 3.87–5.11)
RDW: 12 % (ref 11.5–15.5)
WBC: 6.2 10*3/uL (ref 4.0–10.5)
nRBC: 0 % (ref 0.0–0.2)

## 2023-03-02 SURGERY — BRONCHOSCOPY, WITH EBUS
Anesthesia: General | Laterality: Left

## 2023-03-02 MED ORDER — FENTANYL CITRATE (PF) 250 MCG/5ML IJ SOLN
INTRAMUSCULAR | Status: DC | PRN
  Administered 2023-03-02: 100 ug via INTRAVENOUS

## 2023-03-02 MED ORDER — CHLORHEXIDINE GLUCONATE 0.12 % MT SOLN: 15.0000 mL | Freq: Once | OROMUCOSAL | Status: AC

## 2023-03-02 MED ORDER — CHLORHEXIDINE GLUCONATE 0.12 % MT SOLN
OROMUCOSAL | Status: AC
  Administered 2023-03-02: 15 mL via OROMUCOSAL
  Filled 2023-03-02: qty 15

## 2023-03-02 MED ORDER — PROPOFOL 10 MG/ML IV BOLUS
INTRAVENOUS | Status: DC | PRN
  Administered 2023-03-02: 180 mg via INTRAVENOUS

## 2023-03-02 MED ORDER — ONDANSETRON HCL 4 MG/2ML IJ SOLN
INTRAMUSCULAR | Status: DC | PRN
  Administered 2023-03-02: 4 mg via INTRAVENOUS

## 2023-03-02 MED ORDER — SUGAMMADEX SODIUM 200 MG/2ML IV SOLN
INTRAVENOUS | Status: DC | PRN
  Administered 2023-03-02 (×2): 200 mg via INTRAVENOUS

## 2023-03-02 MED ORDER — LIDOCAINE 2% (20 MG/ML) 5 ML SYRINGE
INTRAMUSCULAR | Status: DC | PRN
  Administered 2023-03-02: 100 mg via INTRAVENOUS

## 2023-03-02 MED ORDER — ROCURONIUM BROMIDE 10 MG/ML (PF) SYRINGE
PREFILLED_SYRINGE | INTRAVENOUS | Status: DC | PRN
  Administered 2023-03-02: 60 mg via INTRAVENOUS

## 2023-03-02 MED ORDER — FENTANYL CITRATE (PF) 100 MCG/2ML IJ SOLN
INTRAMUSCULAR | Status: AC
  Filled 2023-03-02: qty 2

## 2023-03-02 SURGICAL SUPPLY — 29 items
BRUSH CYTOL CELLEBRITY 1.5X140 (MISCELLANEOUS) IMPLANT
CANISTER SUCT 3000ML PPV (MISCELLANEOUS) ×2 IMPLANT
CONT SPEC 4OZ CLIKSEAL STRL BL (MISCELLANEOUS) ×2 IMPLANT
COVER BACK TABLE 60X90IN (DRAPES) ×2 IMPLANT
COVER DOME SNAP 22 D (MISCELLANEOUS) ×2 IMPLANT
FORCEPS BIOP RJ4 1.8 (CUTTING FORCEPS) IMPLANT
GAUZE SPONGE 4X4 12PLY STRL (GAUZE/BANDAGES/DRESSINGS) ×2 IMPLANT
GLOVE BIO SURGEON STRL SZ7.5 (GLOVE) ×2 IMPLANT
GOWN STRL REUS W/ TWL LRG LVL3 (GOWN DISPOSABLE) ×2 IMPLANT
GOWN STRL REUS W/TWL LRG LVL3 (GOWN DISPOSABLE) ×2
KIT CLEAN ENDO COMPLIANCE (KITS) ×4 IMPLANT
KIT TURNOVER KIT B (KITS) ×2 IMPLANT
MARKER SKIN DUAL TIP RULER LAB (MISCELLANEOUS) ×2 IMPLANT
NDL EBUS SONO TIP PENTAX (NEEDLE) ×2 IMPLANT
NEEDLE EBUS SONO TIP PENTAX (NEEDLE) ×2
NS IRRIG 1000ML POUR BTL (IV SOLUTION) ×2 IMPLANT
OIL SILICONE PENTAX (PARTS (SERVICE/REPAIRS)) ×2 IMPLANT
PAD ARMBOARD 7.5X6 YLW CONV (MISCELLANEOUS) ×4 IMPLANT
SOL ANTI FOG 6CC (MISCELLANEOUS) ×2 IMPLANT
SYR 20CC LL (SYRINGE) ×4 IMPLANT
SYR 20ML ECCENTRIC (SYRINGE) ×4 IMPLANT
SYR 50ML SLIP (SYRINGE) IMPLANT
SYR 5ML LUER SLIP (SYRINGE) ×2 IMPLANT
TOWEL OR 17X24 6PK STRL BLUE (TOWEL DISPOSABLE) ×2 IMPLANT
TRAP SPECIMEN MUCOUS 40CC (MISCELLANEOUS) IMPLANT
TUBE CONNECTING 20X1/4 (TUBING) ×4 IMPLANT
UNDERPAD 30X30 (UNDERPADS AND DIAPERS) ×2 IMPLANT
VALVE DISPOSABLE (MISCELLANEOUS) ×2 IMPLANT
WATER STERILE IRR 1000ML POUR (IV SOLUTION) ×2 IMPLANT

## 2023-03-02 NOTE — Anesthesia Preprocedure Evaluation (Signed)
Anesthesia Evaluation  Patient identified by MRN, date of birth, ID band Patient awake    Reviewed: Allergy & Precautions, NPO status , Patient's Chart, lab work & pertinent test results  Airway Mallampati: II       Dental  (+) Partial Upper, Dental Advisory Given   Pulmonary COPD,  COPD inhaler, Current Smoker and Patient abstained from smoking.   Pulmonary exam normal breath sounds clear to auscultation       Cardiovascular negative cardio ROS Normal cardiovascular exam Rhythm:Regular Rate:Normal     Neuro/Psych negative neurological ROS  negative psych ROS   GI/Hepatic Neg liver ROS,GERD  ,,  Endo/Other  Obesity  Renal/GU negative Renal ROS  negative genitourinary   Musculoskeletal negative musculoskeletal ROS (+)    Abdominal   Peds  Hematology negative hematology ROS (+)   Anesthesia Other Findings   Reproductive/Obstetrics                             Anesthesia Physical Anesthesia Plan  ASA: 3  Anesthesia Plan: General   Post-op Pain Management: Minimal or no pain anticipated   Induction: Intravenous  PONV Risk Score and Plan: 3 and Treatment may vary due to age or medical condition, Ondansetron, Dexamethasone, Propofol infusion and TIVA  Airway Management Planned: Oral ETT  Additional Equipment: None  Intra-op Plan:   Post-operative Plan: Extubation in OR  Informed Consent: I have reviewed the patients History and Physical, chart, labs and discussed the procedure including the risks, benefits and alternatives for the proposed anesthesia with the patient or authorized representative who has indicated his/her understanding and acceptance.     Dental advisory given  Plan Discussed with: CRNA and Anesthesiologist  Anesthesia Plan Comments:        Anesthesia Quick Evaluation

## 2023-03-02 NOTE — Anesthesia Postprocedure Evaluation (Signed)
Anesthesia Post Note  Patient: Debra Lutz  Procedure(s) Performed: VIDEO BRONCHOSCOPY WITH ENDOBRONCHIAL ULTRASOUND (Left) BRONCHIAL NEEDLE ASPIRATION BIOPSIES     Patient location during evaluation: PACU Anesthesia Type: General Level of consciousness: awake and alert and oriented Pain management: pain level controlled Vital Signs Assessment: post-procedure vital signs reviewed and stable Respiratory status: spontaneous breathing, nonlabored ventilation and respiratory function stable Cardiovascular status: blood pressure returned to baseline and stable Postop Assessment: no apparent nausea or vomiting Anesthetic complications: no   There were no known notable events for this encounter.  Last Vitals:  Vitals:   03/02/23 1245 03/02/23 1256  BP: 102/62 105/83  Pulse: 78 80  Resp: 18 15  Temp:  36.6 C  SpO2: 92% 93%    Last Pain:  Vitals:   03/02/23 1256  TempSrc:   PainSc: 0-No pain                 Arnulfo Batson A.

## 2023-03-02 NOTE — Anesthesia Procedure Notes (Signed)
Procedure Name: Intubation Date/Time: 03/02/2023 11:30 AM  Performed by: Allyn Kenner, CRNAPre-anesthesia Checklist: Patient identified, Emergency Drugs available, Suction available and Patient being monitored Patient Re-evaluated:Patient Re-evaluated prior to induction Oxygen Delivery Method: Circle System Utilized Preoxygenation: Pre-oxygenation with 100% oxygen Induction Type: IV induction Ventilation: Mask ventilation without difficulty Laryngoscope Size: Mac and 3 Grade View: Grade I Tube type: Oral Tube size: 8.5 mm Number of attempts: 1 Airway Equipment and Method: Stylet and Oral airway Placement Confirmation: ETT inserted through vocal cords under direct vision, positive ETCO2 and breath sounds checked- equal and bilateral Tube secured with: Tape Dental Injury: Teeth and Oropharynx as per pre-operative assessment

## 2023-03-02 NOTE — Transfer of Care (Signed)
Immediate Anesthesia Transfer of Care Note  Patient: Debra Lutz  Procedure(s) Performed: VIDEO BRONCHOSCOPY WITH ENDOBRONCHIAL ULTRASOUND (Left) BRONCHIAL NEEDLE ASPIRATION BIOPSIES  Patient Location: PACU  Anesthesia Type:General  Level of Consciousness: awake, alert , and oriented  Airway & Oxygen Therapy: Patient Spontanous Breathing and Patient connected to face mask oxygen  Post-op Assessment: Report given to RN and Post -op Vital signs reviewed and stable  Post vital signs: Reviewed and stable  Last Vitals:  Vitals Value Taken Time  BP 121/90 03/02/23 1226  Temp    Pulse 95 03/02/23 1227  Resp 25 03/02/23 1227  SpO2 93 % 03/02/23 1227  Vitals shown include unfiled device data.  Last Pain:  Vitals:   03/02/23 0942  TempSrc:   PainSc: 0-No pain         Complications: No notable events documented.

## 2023-03-02 NOTE — Interval H&P Note (Signed)
History and Physical Interval Note:  03/02/2023 11:05 AM  Debra Lutz  has presented today for surgery, with the diagnosis of lung nodule.  The various methods of treatment have been discussed with the patient and family. After consideration of risks, benefits and other options for treatment, the patient has consented to  Procedure(s): ROBOTIC ASSISTED NAVIGATIONAL BRONCHOSCOPY (Left) VIDEO BRONCHOSCOPY WITH ENDOBRONCHIAL ULTRASOUND (Left) as a surgical intervention.  The patient's history has been reviewed, patient examined, no change in status, stable for surgery.  I have reviewed the patient's chart and labs.  Questions were answered to the patient's satisfaction.     Rachel Bo Harel Repetto

## 2023-03-02 NOTE — Op Note (Signed)
Video Bronchoscopy with Endobronchial Ultrasound Procedure Note  Date of Operation: 03/02/2023  Pre-op Diagnosis: Adenopathy   Post-op Diagnosis: Adenopathy   Surgeon: Josephine Igo, DO   Assistants: None   Anesthesia: General endotracheal anesthesia  Operation: Flexible video fiberoptic bronchoscopy with endobronchial ultrasound and biopsies.  Estimated Blood Loss: Minimal  Complications: None   Indications and History: Debra Lutz is a 62 y.o. female with LUL lung mass, and adenopathy.  The risks, benefits, complications, treatment options and expected outcomes were discussed with the patient.  The possibilities of pneumothorax, pneumonia, reaction to medication, pulmonary aspiration, perforation of a viscus, bleeding, failure to diagnose a condition and creating a complication requiring transfusion or operation were discussed with the patient who freely signed the consent.    Description of Procedure: The patient was examined in the preoperative area and history and data from the preprocedure consultation were reviewed. It was deemed appropriate to proceed.  The patient was taken to [facility] endo 3, identified as Debra Lutz and the procedure verified as Flexible Video Fiberoptic Bronchoscopy.  A Time Out was held and the above information confirmed. After being taken to the operating room general anesthesia was initiated and the patient  was orally intubated. The video fiberoptic bronchoscope was introduced via the endotracheal tube and a general inspection was performed which showed normal appearing airways, no endobronchial lesion, LUL apical segment with hyperemic mucosa. The standard scope was then withdrawn and the endobronchial ultrasound was used to identify and characterize the peritracheal, hilar and bronchial lymph nodes. Inspection showed enlarged station 10L. Using real-time ultrasound guidance Wang needle biopsies were take from Station 10L nodes and were sent for cytology. The  patient tolerated the procedure well without apparent complications. There was no significant blood loss. The bronchoscope was withdrawn. Anesthesia was reversed and the patient was taken to the PACU for recovery.   Samples: 1. Wang needle biopsies from 10L node  Plans:  The patient will be discharged from the PACU to home when recovered from anesthesia. We will review the cytology, pathology results with the patient when they become available. Outpatient followup will be with Kandice Robinsons, NP.   Josephine Igo, DO [facility] 03/02/2023 11:59 AM

## 2023-03-02 NOTE — Discharge Instructions (Signed)
Flexible Bronchoscopy, Care After This sheet gives you information about how to care for yourself after your test. Your doctor may also give you more specific instructions. If you have problems or questions, contact your doctor. Follow these instructions at home: Eating and drinking Do not eat or drink anything (not even water) for 2 hours after your test, or until your numbing medicine (local anesthetic) wears off. When your numbness is gone and your cough and gag reflexes have come back, you may: Eat only soft foods. Slowly drink liquids. The day after the test, go back to your normal diet. Driving Do not drive for 24 hours if you were given a medicine to help you relax (sedative). Do not drive or use heavy machinery while taking prescription pain medicine. General instructions  Take over-the-counter and prescription medicines only as told by your doctor. Return to your normal activities as told. Ask what activities are safe for you. Do not use any products that have nicotine or tobacco in them. This includes cigarettes and e-cigarettes. If you need help quitting, ask your doctor. Keep all follow-up visits as told by your doctor. This is important. It is very important if you had a tissue sample (biopsy) taken. Get help right away if: You have shortness of breath that gets worse. You get light-headed. You feel like you are going to pass out (faint). You have chest pain. You cough up: More than a little blood. More blood than before. Summary Do not eat or drink anything (not even water) for 2 hours after your test, or until your numbing medicine wears off. Do not use cigarettes. Do not use e-cigarettes. Get help right away if you have chest pain.  This information is not intended to replace advice given to you by your health care provider. Make sure you discuss any questions you have with your health care provider. Document Released: 02/16/2009 Document Revised: 04/03/2017 Document  Reviewed: 05/09/2016 Elsevier Patient Education  2020 Elsevier Inc.  

## 2023-03-03 ENCOUNTER — Encounter (HOSPITAL_COMMUNITY): Payer: Self-pay | Admitting: Pulmonary Disease

## 2023-03-03 LAB — CYTOLOGY - NON PAP

## 2023-03-09 NOTE — Progress Notes (Signed)
Sg seeing you tomorrow   Thanks,  BLI  Josephine Igo, DO [facility] 03/09/2023 9:48 AM

## 2023-03-10 ENCOUNTER — Encounter: Payer: Self-pay | Admitting: Acute Care

## 2023-03-10 ENCOUNTER — Telehealth (INDEPENDENT_AMBULATORY_CARE_PROVIDER_SITE_OTHER): Payer: Self-pay | Admitting: Acute Care

## 2023-03-10 DIAGNOSIS — Z9889 Other specified postprocedural states: Secondary | ICD-10-CM

## 2023-03-10 DIAGNOSIS — C3490 Malignant neoplasm of unspecified part of unspecified bronchus or lung: Secondary | ICD-10-CM

## 2023-03-10 DIAGNOSIS — F17210 Nicotine dependence, cigarettes, uncomplicated: Secondary | ICD-10-CM

## 2023-03-10 DIAGNOSIS — F17200 Nicotine dependence, unspecified, uncomplicated: Secondary | ICD-10-CM

## 2023-03-10 DIAGNOSIS — J449 Chronic obstructive pulmonary disease, unspecified: Secondary | ICD-10-CM

## 2023-03-10 NOTE — Patient Instructions (Addendum)
It was good to see you today. I am so glad you have done well since your procedure.  You biopsy did show a non small cell lung cancer. We will refer you to both medical oncology and radiation oncology to be evaluated for treatment. Treatment may involve both options.  Continue to work on quitting smoking.  Here are some options that are not nicotine replacement therapy.  You can receive free nicotine replacement therapy ( patches, gum or mints) by calling 1-800-QUIT NOW. Please call so we can get you on the path to becoming  a non-smoker. I know it is hard, but you can do this!  Other options for assistance in smoking cessation ( As covered by your insurance benefits)  Hypnosis for smoking cessation  [employer] [phone]  Acupuncture for smoking cessation  [employer] [phone]    Please call if you need anything. You will get great care at the [facility].  Please contact office for sooner follow up if symptoms do not improve or worsen or seek emergency care

## 2023-03-10 NOTE — Progress Notes (Signed)
Virtual Visit via Video Note  I connected with Debra Lutz on 03/10/23 at  1:30 PM EST by a video enabled telemedicine application and verified that I am speaking with the correct person using two identifiers.  Location: Patient:  At [facility] office, patient accidentally went to wrong location Provider: [address], [place], [place], [address]    I discussed the limitations of evaluation and management by telemedicine and the availability of in person appointments. The patient expressed understanding and agreed to proceed.  Pt. Visit was converted to a virtual video visit as patient presented to the [place] office instead of to the [place] office where her visit was scheduled.   Synopsis 62 year old female current every day smoker with longstanding history of tobacco abuse, COPD, longstanding history of cigarettes daily smoker at least a pack per day. She had CT imaging that was completed at [facility] health care in [place]. That found a lung nodule she was referred to pulmonary seen by Dr. Sherene Sires.Patient had a September 2024 was seen for evaluation and had a nuclear medicine PET scan that was completed. PET scan completed on 02/05/2023 revealed a hypermetabolic left upper lobe pulmonary mass measuring 3.1 cm suspicious for bronchogenic carcinoma with associated hypermetabolic left hilar adenopathy and AP window adenopathy. Patient was referred to see Dr. Tonia Brooms  for consideration of bronchoscopy with  biopsy. She underwent Flexible video fiberoptic bronchoscopy with endobronchial ultrasound and biopsies on 03/02/2023. She is here to review the results of her cytology, and to ensure she has done well post procedure.   History of Present Illness:  Discussed the use of AI scribe software for clinical note transcription with the patient, who gave verbal consent to proceed.  History of Present Illness   The patient, presents for follow up after bronchoscopy with biopsies 03/02/2023.Debra Lutz  Biopsies are positive for non small cell lung cancer. She reports no post-procedure complications such as bleeding, infection, fever, or changes in sputum. She denies any adverse reactions to anesthesia during the procedure.  The patient has a long-standing history of smoking, with multiple unsuccessful attempts at cessation using patches, lozenges, gum, and Wellbutrin. The longest period of abstinence was three weeks. She expresses a desire to quit smoking, particularly in light of the recent cancer diagnosis.  The patient resides in [place], [place], approximately 40 minutes away from the proposed treatment centers in [place] and [place]. She expresses a preference for treatment at [facility] due to similar travel times.   We discussed that I will refer her to medical oncology as well as radiation oncology. She understands she will get a call to get both appointments scheduled. She is interested in knowing her options for treatment. We discussed that treatment has changed and that we will get her in to be seen to treatment can be started.                Observations/Objective: Test Results: Cytology 03/02/2023 FINAL MICROSCOPIC DIAGNOSIS:   A. LYMPH NODE, TARGET 1 11L, FINE NEEDLE ASPIRATION:  - Malignant cells present  - Non-small cell carcinoma  - See comment      COMMENT:   Definitive tumor cells are not identified in the cellblock, precluding  further assessment.  Dr. Tonia Brooms was notified on 03/03/2023.    Assessment and Plan:    Non-Small Cell Lung Carcinoma in a current every day smoker  Diagnosed via bronchoscopy after suspicious lung nodule identified on PET scan. No post-procedure complications reported. -Refer to Medical Oncology in  [place] for treatment planning. -Refer to Radiation Oncology in [place] for potential stereotactic radiation therapy. - Treatment may involve both options.  - You will get a call to schedule both appointments   Tobacco  Use Long-term smoker with unsuccessful quit attempts using nicotine replacement therapy and Wellbutrin. -Provide contact information for hypnosis and acupuncture services for smoking cessation. -Encourage behavioral therapy for smoking cessation.  -Continue to work on quitting smoking. >> smoking cessation counseling x 3 minutes today.  Here are some options that may help You can receive free nicotine replacement therapy ( patches, gum or mints) by calling 1-800-QUIT NOW. Please call so we can get you on the path to becoming  a non-smoker. I know it is hard, but you can do this!  Other options for assistance in smoking cessation ( As covered by your insurance benefits)  Hypnosis for smoking cessation  [employer] [phone]  Acupuncture for smoking cessation  [employer] [phone]      Please call if you need anything. You will get great care at the [facility].  Please contact office for sooner follow up if symptoms do not improve or worsen or seek emergency care    Follow Up Instructions: Referrals placed    I discussed the assessment and treatment plan with the patient. The patient was provided an opportunity to ask questions and all were answered. The patient agreed with the plan and demonstrated an understanding of the instructions.   The patient was advised to call back or seek an in-person evaluation if the symptoms worsen or if the condition fails to improve as anticipated.  I spent 20 minutes dedicated to the care of this patient on the date of this encounter to include pre-visit review of records, face-to-face time with the patient discussing conditions above, post visit ordering of testing, clinical documentation with the electronic health record, making appropriate referrals as documented, and communicating necessary information to the patient's healthcare team.     [name], NP 03/10/2023  1:17 PM

## 2023-03-12 ENCOUNTER — Other Ambulatory Visit: Payer: Self-pay

## 2023-03-12 ENCOUNTER — Telehealth: Payer: Self-pay | Admitting: Radiation Oncology

## 2023-03-12 DIAGNOSIS — R911 Solitary pulmonary nodule: Secondary | ICD-10-CM

## 2023-03-12 NOTE — Telephone Encounter (Signed)
Left message for patient to call back to schedule consult per 11/5 referral.

## 2023-03-13 ENCOUNTER — Telehealth: Payer: Self-pay | Admitting: Radiation Oncology

## 2023-03-13 NOTE — Telephone Encounter (Signed)
 Left message for patient to call back to schedule consult per 11/5 referral.

## 2023-03-16 ENCOUNTER — Telehealth: Payer: Self-pay | Admitting: Radiation Oncology

## 2023-03-16 NOTE — Telephone Encounter (Signed)
 Left message for patient to call back to schedule consult per 11/5 referral.

## 2023-03-16 NOTE — Progress Notes (Signed)
Location of tumor and Histology per Pathology Report: Left  Biopsy:     Past/Anticipated interventions by surgeon, if any: left       Past/Anticipated interventions by medical oncology, if any: Patient to see Dr. Arbutus Ped on 03/19/23     Pain issues, if any:  {:18581} {PAIN DESCRIPTION:21022940}  SAFETY ISSUES: Prior radiation? {:18581} Pacemaker/ICD? {:18581} Possible current pregnancy? no Is the patient on methotrexate? {:18581}  Current Complaints / other details:  ***     ***

## 2023-03-18 ENCOUNTER — Ambulatory Visit
Admission: RE | Admit: 2023-03-18 | Discharge: 2023-03-18 | Disposition: A | Discharge status: Home / Self Care | Payer: Self-pay | Source: Ambulatory Visit | Attending: Radiation Oncology | Admitting: Radiation Oncology

## 2023-03-18 ENCOUNTER — Encounter: Payer: Self-pay | Admitting: Radiation Oncology

## 2023-03-18 VITALS — BP 131/85 | HR 72 | Temp 97.5°F | Resp 20 | Ht 63.0 in | Wt 168.4 lb

## 2023-03-18 DIAGNOSIS — I2510 Atherosclerotic heart disease of native coronary artery without angina pectoris: Secondary | ICD-10-CM | POA: Insufficient documentation

## 2023-03-18 DIAGNOSIS — C3412 Malignant neoplasm of upper lobe, left bronchus or lung: Secondary | ICD-10-CM | POA: Insufficient documentation

## 2023-03-18 DIAGNOSIS — I700 Atherosclerosis of aorta: Secondary | ICD-10-CM | POA: Insufficient documentation

## 2023-03-18 DIAGNOSIS — R911 Solitary pulmonary nodule: Secondary | ICD-10-CM

## 2023-03-18 DIAGNOSIS — F17210 Nicotine dependence, cigarettes, uncomplicated: Secondary | ICD-10-CM | POA: Insufficient documentation

## 2023-03-18 DIAGNOSIS — S2221XA Fracture of manubrium, initial encounter for closed fracture: Secondary | ICD-10-CM | POA: Insufficient documentation

## 2023-03-18 DIAGNOSIS — Z8052 Family history of malignant neoplasm of bladder: Secondary | ICD-10-CM | POA: Insufficient documentation

## 2023-03-18 DIAGNOSIS — J449 Chronic obstructive pulmonary disease, unspecified: Secondary | ICD-10-CM | POA: Insufficient documentation

## 2023-03-18 DIAGNOSIS — Z809 Family history of malignant neoplasm, unspecified: Secondary | ICD-10-CM | POA: Insufficient documentation

## 2023-03-18 DIAGNOSIS — Z79899 Other long term (current) drug therapy: Secondary | ICD-10-CM | POA: Insufficient documentation

## 2023-03-18 NOTE — Progress Notes (Signed)
Radiation Oncology         [phone] ________________________________  Initial Outpatient Consultation  Name: Debra Lutz MRN: [id number]  Date: 03/18/2023  DOB: 1961-02-14  [id number], No Pcp Per  Josephine Igo, DO   REFERRING PHYSICIAN: Josephine Igo, DO  DIAGNOSIS: The primary encounter diagnosis was Nodule of left lung. A diagnosis of Primary cancer of left upper lobe of lung (HCC) was also pertinent to this visit.  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 female with a long standing smoking history. Today, she is accompanied by her daughter. she is seen as a courtesy of Dr. Tonia Brooms for an opinion concerning radiation therapy as part of management for her recently diagnosed left lung cancer.   The patient had a chest x-ray performed earlier this year on 09/27/22 at [facility] in [place] due to an episode of syncope and reports of chest congestion, which showed a 2.8 cm left suprahilar nodule concerning for malignancy.   She was later referred to Dr. Sherene Sires on 01/15/23 for further evaluation. During that visit, she endorsed dyspnea and a cough (sometimes productive).   She accordingly presented for a PET scan on 02/05/23 which showed a hypermetabolic LUL nodule / mass measuring 3.1 cm, concerning for primary bronchogenic carcinoma; hypermetabolic left hilar and AP window lymph nodes, compatible with nodal metastatic disease; and focal nonspecific hypermetabolic activity in the cecum.  She was then referred to Dr. Tonia Brooms and underwent a bronchoscopy on 03/02/23.  FNA of a target 1 11L lymph collected showed findings consistent with non-small cell carcinoma.   She is also scheduled to meet with Dr. Arbutus Ped in consultation later this afternoon to discuss systemic treatment options if indicated.   Other pertinent imaging performed thus far includes a super D  chest CT on 03/02/23 which showed a slight interval increase in size of the dominant LUL mass, consistent with primary bronchogenic carcinoma. The smaller AP window and hilar lymph nodes otherwise showed interval stability, but remain suspicious for metastatic disease.   PREVIOUS RADIATION THERAPY: No  PAST MEDICAL HISTORY:  Past Medical History:  Diagnosis Date   COPD (chronic obstructive pulmonary disease) (HCC)    Lung nodule 09/2022   hypermetabolic left upper lobe    PAST SURGICAL HISTORY: Past Surgical History:  Procedure Laterality Date   BRONCHIAL NEEDLE ASPIRATION BIOPSY  03/02/2023   Procedure: BRONCHIAL NEEDLE ASPIRATION BIOPSIES;  Surgeon: Josephine Igo, DO;  Location: [facility];  Service: Pulmonary;;   VIDEO BRONCHOSCOPY WITH ENDOBRONCHIAL ULTRASOUND Left 03/02/2023   Procedure: VIDEO BRONCHOSCOPY WITH ENDOBRONCHIAL ULTRASOUND;  Surgeon: Josephine Igo, DO;  Location: [facility];  Service: Pulmonary;  Laterality: Left;    FAMILY HISTORY:  Family History  Problem Relation Age of Onset   Bladder Cancer Father    Cancer Paternal Uncle     SOCIAL HISTORY:  Social History   Tobacco Use   Smoking status: Every Day    Current packs/day: 1.00    Types: Cigarettes  Vaping Use   Vaping status: Never Used  Substance Use Topics   Alcohol use: No   Drug use: Yes    Types: Marijuana    Comment: instructed to withhold 1-2 days prior to procedure.    ALLERGIES:  Allergies  Allergen Reactions   Wixela Inhub [Fluticasone-Salmeterol] Other (See Comments)    Having tightness in chest ,joint pain , loss of  appetite     MEDICATIONS:  Current Outpatient Medications  Medication Sig Dispense Refill   albuterol (ACCUNEB) 0.63 MG/3ML nebulizer solution Take 1 ampule by nebulization every 6 (six) hours as needed for wheezing.     albuterol (VENTOLIN HFA) 108 (90 Base) MCG/ACT inhaler Inhale into the lungs every 6 (six) hours as needed for wheezing or shortness of  breath. Using at night     fexofenadine (ALLEGRA ALLERGY) 180 MG tablet Take 180 mg by mouth daily.     ibuprofen (ADVIL) 200 MG tablet Take 200 mg by mouth every 4 (four) hours as needed for mild pain (pain score 1-3).     Multiple Vitamins-Minerals (MULTIVITAMINS THER. W/MINERALS) TABS tablet Take 1 tablet by mouth daily.     No current facility-administered medications for this encounter.    REVIEW OF SYSTEMS:  A 10+ POINT REVIEW OF SYSTEMS WAS OBTAINED including neurology, dermatology, psychiatry, cardiac, respiratory, lymph, extremities, GI, GU, musculoskeletal, constitutional, reproductive, HEENT.  She denies any headache dizziness or blurred vision.  Per discussion with the daughter there has been no unusual behavior.  She denies any pain within the chest area significant cough or hemoptysis.  She has mild dyspnea on exertion   PHYSICAL EXAM:  height is 5\' 3"  (1.6 m) and weight is 168 lb 6.4 oz (76.4 kg). Her temperature is 97.5 F (36.4 C) (abnormal). Her blood pressure is 131/85 and her pulse is 72. Her respiration is 20 and oxygen saturation is 100%.   General: Alert and oriented, in no acute distress HEENT: Head is normocephalic. Extraocular movements are intact. Oropharynx is clear.  Dentures in place. Neck: Neck is supple, no palpable cervical or supraclavicular lymphadenopathy. Heart: Regular in rate and rhythm with no murmurs, rubs, or gallops. Chest: Clear to auscultation bilaterally, with no rhonchi, wheezes, or rales. Abdomen: Soft, nontender, nondistended, with no rigidity or guarding. Extremities: No cyanosis or edema. Lymphatics: see Neck Exam Skin: No concerning lesions. Musculoskeletal: symmetric strength and muscle tone throughout. Neurologic: Cranial nerves II through XII are grossly intact. No obvious focalities. Speech is fluent. Coordination is intact. Psychiatric: Judgment and insight are intact. Affect is appropriate.   ECOG = 1  0 - Asymptomatic (Fully  active, able to carry on all predisease activities without restriction)  1 - Symptomatic but completely ambulatory (Restricted in physically strenuous activity but ambulatory and able to carry out work of a light or sedentary nature. For example, light housework, office work)  2 - Symptomatic, <50% in bed during the day (Ambulatory and capable of all self care but unable to carry out any work activities. Up and about more than 50% of waking hours)  3 - Symptomatic, >50% in bed, but not bedbound (Capable of only limited self-care, confined to bed or chair 50% or more of waking hours)  4 - Bedbound (Completely disabled. Cannot carry on any self-care. Totally confined to bed or chair)  5 - Death   Santiago Glad MM, Creech RH, Tormey DC, et al. [phone]). "Toxicity and response criteria of the [facility] [employer]". Am. Evlyn Clines. Oncol. 5 (6): 649-55  LABORATORY DATA:  Lab Results  Component Value Date   WBC 6.2 03/02/2023   HGB 15.1 (H) 03/02/2023   HCT 45.0 03/02/2023   MCV 89.5 03/02/2023   PLT 284 03/02/2023   No results found for: "NA", "K", "CL", "CO2", "GLUCOSE", "BUN", "CREATININE", "CALCIUM"    RADIOGRAPHY: CT SUPER D CHEST WO CONTRAST  Result Date: 03/02/2023 CLINICAL DATA:  Surgical planning.  Pre-op. Hypermetabolic left upper lobe pulmonary mass on PET-CT, suspicious for bronchogenic carcinoma. * Tracking Code: BO * EXAM: CT CHEST WITHOUT CONTRAST TECHNIQUE: Multidetector CT imaging of the chest was performed using thin slice collimation for electromagnetic bronchoscopy planning purposes, without intravenous contrast. RADIATION DOSE REDUCTION: This exam was performed according to the departmental dose-optimization program which includes automated exposure control, adjustment of the mA and/or kV according to patient size and/or use of iterative reconstruction technique. COMPARISON:  PET-CT 02/05/2023.  No other comparison studies. FINDINGS: Cardiovascular: Mild aortic and coronary  artery atherosclerosis. The heart size is normal. There is no pericardial effusion. Mediastinum/Nodes: 11 mm short axis AP window lymph node on image 50/3 was hypermetabolic on PET-CT.There is also a small hypermetabolic left hilar lymph node on PET-CT, not well seen on this noncontrast examination. No other enlarged mediastinal or axillary lymph nodes identified. The thyroid gland, trachea and esophagus demonstrate no significant findings. Lungs/Pleura: No pleural effusion or pneumothorax. The dominant spiculated left upper lobe mass measures 3.6 x 2.6 cm on image 45/4 (previously 3.3 x 2.5 cm, remeasured). No other suspicious pulmonary nodules. There is mild atelectasis peripheral to the left upper lobe mass and mild central airway thickening. Upper abdomen: No significant findings are seen in the visualized upper abdomen. Musculoskeletal/Chest wall: No chest wall mass or evidence of osseous metastatic disease. Subacute appearing fracture of the sternal manubrium is grossly unchanged from recent PET-CT, although most obvious on the reformatted images. There are degenerative changes within the thoracic spine. Unless specific follow-up recommendations are mentioned in the findings or impression sections, no imaging follow-up of any mentioned incidental findings is recommended. IMPRESSION: 1. Imaging for bronchoscopy planning and guidance. 2. Slight interval enlargement of the dominant spiculated left upper lobe mass, consistent with primary bronchogenic carcinoma. 3. Grossly stable small AP window and hilar lymph nodes, hypermetabolic on PET-CT, suspicious for metastatic disease. 4. No other evidence of metastatic disease. 5. Subacute appearing fracture of the sternal manubrium, grossly unchanged from recent PET-CT. 6.  Aortic Atherosclerosis (ICD10-I70.0). Electronically Signed   By: Carey Bullocks M.D.   On: 03/02/2023 16:19      IMPRESSION: 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 nodal  involvement  Presumptive stage IIIa non-small cell lung cancer.  Brain MRI has been ordered today.  Assuming no evidence of intracranial metastasis, she would be a good candidate for definitive course of radiation along with radiosensitizing chemotherapy.  Today, I talked to the patient and her daughter about the findings and work-up thus far.  We discussed the natural history of non-small cell lung cancer and general treatment, highlighting the role of radiotherapy in the management.  We discussed the available radiation techniques, and focused on the details of logistics and delivery.  We reviewed the anticipated acute and late sequelae associated with radiation in this setting.  The patient was encouraged to ask questions that I answered to the best of my ability.  A patient consent form was discussed and signed.  We retained a copy for our records.  The patient would like to proceed with radiation and will be scheduled for CT simulation.  PLAN: She will meet with Dr. Arbutus Ped in the near future.  She is scheduled for CT simulation early next week with treatments to begin approximately a week later.  Anticipate 6 weeks of radiation therapy along with radiosensitizing chemotherapy.  Treatment plans could change if brain MRI shows metastasis.   60 minutes of total time was spent for this patient  encounter, including preparation, face-to-face counseling with the patient and coordination of care, physical exam, and documentation of the encounter.   ------------------------------------------------  Billie Lade, PhD, MD  This document serves as a record of services personally performed by Antony Blackbird, MD. It was created on his behalf by Neena Rhymes, a trained medical scribe. The creation of this record is based on the scribe's personal observations and the provider's statements to them. This document has been checked and approved by the attending provider.

## 2023-03-19 ENCOUNTER — Inpatient Hospital Stay: Payer: Self-pay | Attending: Internal Medicine

## 2023-03-19 ENCOUNTER — Inpatient Hospital Stay (HOSPITAL_BASED_OUTPATIENT_CLINIC_OR_DEPARTMENT_OTHER): Payer: Self-pay | Admitting: Internal Medicine

## 2023-03-19 ENCOUNTER — Encounter (HOSPITAL_COMMUNITY): Payer: Self-pay

## 2023-03-19 VITALS — BP 115/69 | HR 64 | Temp 98.0°F | Resp 16 | Ht 63.0 in | Wt 169.0 lb

## 2023-03-19 DIAGNOSIS — Z8052 Family history of malignant neoplasm of bladder: Secondary | ICD-10-CM

## 2023-03-19 DIAGNOSIS — R911 Solitary pulmonary nodule: Secondary | ICD-10-CM

## 2023-03-19 DIAGNOSIS — C3412 Malignant neoplasm of upper lobe, left bronchus or lung: Secondary | ICD-10-CM | POA: Insufficient documentation

## 2023-03-19 DIAGNOSIS — F17210 Nicotine dependence, cigarettes, uncomplicated: Secondary | ICD-10-CM | POA: Insufficient documentation

## 2023-03-19 DIAGNOSIS — Z79899 Other long term (current) drug therapy: Secondary | ICD-10-CM | POA: Insufficient documentation

## 2023-03-19 DIAGNOSIS — J449 Chronic obstructive pulmonary disease, unspecified: Secondary | ICD-10-CM

## 2023-03-19 DIAGNOSIS — Z5111 Encounter for antineoplastic chemotherapy: Secondary | ICD-10-CM | POA: Insufficient documentation

## 2023-03-19 LAB — CBC WITH DIFFERENTIAL (CANCER CENTER ONLY)
Abs Immature Granulocytes: 0.02 10*3/uL (ref 0.00–0.07)
Basophils Absolute: 0.1 10*3/uL (ref 0.0–0.1)
Basophils Relative: 1 %
Eosinophils Absolute: 0.1 10*3/uL (ref 0.0–0.5)
Eosinophils Relative: 1 %
HCT: 42.7 % (ref 36.0–46.0)
Hemoglobin: 14.6 g/dL (ref 12.0–15.0)
Immature Granulocytes: 0 %
Lymphocytes Relative: 23 %
Lymphs Abs: 1.5 10*3/uL (ref 0.7–4.0)
MCH: 30.6 pg (ref 26.0–34.0)
MCHC: 34.2 g/dL (ref 30.0–36.0)
MCV: 89.5 fL (ref 80.0–100.0)
Monocytes Absolute: 0.4 10*3/uL (ref 0.1–1.0)
Monocytes Relative: 7 %
Neutro Abs: 4.4 10*3/uL (ref 1.7–7.7)
Neutrophils Relative %: 68 %
Platelet Count: 269 10*3/uL (ref 150–400)
RBC: 4.77 MIL/uL (ref 3.87–5.11)
RDW: 11.9 % (ref 11.5–15.5)
WBC Count: 6.5 10*3/uL (ref 4.0–10.5)
nRBC: 0 % (ref 0.0–0.2)

## 2023-03-19 LAB — CMP (CANCER CENTER ONLY)
ALT: 8 U/L (ref 0–44)
AST: 10 U/L — ABNORMAL LOW (ref 15–41)
Albumin: 4.3 g/dL (ref 3.5–5.0)
Alkaline Phosphatase: 100 U/L (ref 38–126)
Anion gap: 5 (ref 5–15)
BUN: 9 mg/dL (ref 8–23)
CO2: 32 mmol/L (ref 22–32)
Calcium: 9.7 mg/dL (ref 8.9–10.3)
Chloride: 103 mmol/L (ref 98–111)
Creatinine: 0.82 mg/dL (ref 0.44–1.00)
GFR, Estimated: >60 mL/min (ref >60)
Glucose, Bld: 100 mg/dL — ABNORMAL HIGH (ref 70–99)
Potassium: 4.5 mmol/L (ref 3.5–5.1)
Sodium: 140 mmol/L (ref 135–145)
Total Bilirubin: 0.7 mg/dL (ref <1.2)
Total Protein: 7.3 g/dL (ref 6.5–8.1)

## 2023-03-19 NOTE — Progress Notes (Signed)
[name]  She was accompanied by her daughter Debra Lutz.  Discussed the use of AI scribe software for clinical note transcription with the patient, who gave verbal consent to proceed.  History of Present Illness   Debra Lutz, a 62 year old with a recent diagnosis of lung cancer, presents for consultation. The patient first became aware of a spot on her left lung approximately 9-10 years ago during a visit to an urgent care center. At that time, she was told it was nothing to worry about. 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  In May 2024, the patient experienced a syncopal episode at home, leading to an emergency room visit in [place], [place]. A CT scan revealed a nodule in the left lung, and the patient was advised to seek further medical attention. Subsequent consultation at an urgent care center in [place] led to a referral to a pulmonologist, Dr. Tonia Brooms. The pulmonologist noted that the nodule was more than just a simple lung nodule, with lymph node involvement also present.  In October 2024, the patient underwent a bronchoscopy, which revealed a mass in the left lower lobe. However, the biopsy material was insufficient to determine the type of non-small cell lung cancer. The only certainty was that it was not a small cell lung cancer.  The patient reports feeling well overall, with no chest pain, shortness of breath, or hemoptysis. She does experience a cough, primarily at night. The patient is attempting to quit smoking after a 48-year history of approximately one pack per day. She  denies any nausea, vomiting, diarrhea, headaches, or changes in vision. The patient has not experienced any recent weight loss.  The patient's past medical history includes COPD and left knee surgery following a fracture. She has no history of diabetes, hypertension, heart disease, or stroke. The patient's father had bladder cancer, and her mother had heart disease. 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 She lives in [place], [place].        Past Medical History:  Diagnosis Date   COPD (chronic obstructive pulmonary disease) (HCC)    Lung nodule 09/2022   hypermetabolic left upper lobe    Past Surgical History:  Procedure Laterality Date   BRONCHIAL NEEDLE ASPIRATION BIOPSY  03/02/2023   Procedure: BRONCHIAL NEEDLE ASPIRATION BIOPSIES;  Surgeon: Josephine Igo, DO;  Location: [facility];  Service: Pulmonary;;   VIDEO BRONCHOSCOPY WITH ENDOBRONCHIAL ULTRASOUND Left 03/02/2023   Procedure: VIDEO BRONCHOSCOPY WITH ENDOBRONCHIAL ULTRASOUND;  Surgeon: Josephine Igo, DO;  Location: [facility];  Service: Pulmonary;  Laterality: Left;    Family History  Problem Relation Age of Onset   Bladder Cancer Father    Cancer Paternal Uncle     Social History Social History   Tobacco Use   Smoking status: Every Day    Current packs/day: 1.00    Types: Cigarettes  Vaping Use   Vaping status: Never Used  Substance Use Topics   Alcohol use: No   Drug use: Yes    Types: Marijuana    Comment: instructed to withhold 1-2 days prior to procedure.  Allergies  Allergen Reactions   Wixela Inhub [Fluticasone-Salmeterol] Other (See Comments)    Having tightness in chest ,joint pain , loss of appetite     Current Outpatient Medications  Medication Sig Dispense Refill   albuterol (ACCUNEB) 0.63 MG/3ML nebulizer solution Take 1 ampule by nebulization every 6 (six) hours as needed for wheezing.     albuterol  (VENTOLIN HFA) 108 (90 Base) MCG/ACT inhaler Inhale into the lungs every 6 (six) hours as needed for wheezing or shortness of breath. Using at night     fexofenadine (ALLEGRA ALLERGY) 180 MG tablet Take 180 mg by mouth daily.     ibuprofen (ADVIL) 200 MG tablet Take 200 mg by mouth every 4 (four) hours as needed for mild pain (pain score 1-3).     Multiple Vitamins-Minerals (MULTIVITAMINS THER. W/MINERALS) TABS tablet Take 1 tablet by mouth daily.     No current facility-administered medications for this visit.    Review of Systems  Constitutional: negative Eyes: negative Ears, nose, mouth, throat, and face: negative Respiratory: positive for cough Cardiovascular: negative Gastrointestinal: negative Genitourinary:negative Integument/breast: negative Hematologic/lymphatic: negative Musculoskeletal:negative Neurological: negative Behavioral/Psych: negative Endocrine: negative Allergic/Immunologic: negative  Physical Exam  UJW:JXBJY, healthy, no distress, well nourished, and well developed SKIN: skin color, texture, turgor are normal, no rashes or significant lesions HEAD: Normocephalic, No masses, lesions, tenderness or abnormalities EYES: normal, PERRLA, Conjunctiva are pink and non-injected EARS: External ears normal, Canals clear OROPHARYNX:no exudate and no erythema  NECK: supple, no adenopathy, no JVD LYMPH:  no palpable lymphadenopathy, no hepatosplenomegaly BREAST:not examined LUNGS: clear to auscultation , and palpation HEART: regular rate & rhythm, no murmurs, and no gallops ABDOMEN:abdomen soft, non-tender, normal bowel sounds, and no masses or organomegaly BACK: Back symmetric, no curvature., No CVA tenderness EXTREMITIES:no joint deformities, effusion, or inflammation, no edema  NEURO: alert & oriented x 3 with fluent speech, no focal motor/sensory deficits  PERFORMANCE STATUS: ECOG 1  LABORATORY DATA: Lab Results  Component Value Date   WBC 6.5 03/19/2023    HGB 14.6 03/19/2023   HCT 42.7 03/19/2023   MCV 89.5 03/19/2023   PLT 269 03/19/2023      Chemistry   No results found for: "NA", "K", "CL", "CO2", "BUN", "CREATININE", "GLU" No results found for: "CALCIUM", "ALKPHOS", "AST", "ALT", "BILITOT"     RADIOGRAPHIC STUDIES: CT SUPER D CHEST WO CONTRAST  Result Date: 03/02/2023 CLINICAL DATA:  Surgical planning. Pre-op. Hypermetabolic left upper lobe pulmonary mass on PET-CT, suspicious for bronchogenic carcinoma. * Tracking Code: BO * EXAM: CT CHEST WITHOUT CONTRAST TECHNIQUE: Multidetector CT imaging of the chest was performed using thin slice collimation for electromagnetic bronchoscopy planning purposes, without intravenous contrast. RADIATION DOSE REDUCTION: This exam was performed according to the departmental dose-optimization program which includes automated exposure control, adjustment of the mA and/or kV according to patient size and/or use of iterative reconstruction technique. COMPARISON:  PET-CT 02/05/2023.  No other comparison studies. FINDINGS: Cardiovascular: Mild aortic and coronary artery atherosclerosis. The heart size is normal. There is no pericardial effusion. Mediastinum/Nodes: 11 mm short axis AP window lymph node on image 50/3 was hypermetabolic on PET-CT.There is also a small hypermetabolic left hilar lymph node on PET-CT, not well seen on this noncontrast examination. No other enlarged mediastinal or axillary lymph nodes identified. The thyroid gland, trachea and esophagus demonstrate no significant findings. Lungs/Pleura: No pleural effusion or pneumothorax. The dominant spiculated left upper lobe mass measures 3.6 x 2.6 cm on image 45/4 (previously 3.3 x 2.5 cm, remeasured).  No other suspicious pulmonary nodules. There is mild atelectasis peripheral to the left upper lobe mass and mild central airway thickening. Upper abdomen: No significant findings are seen in the visualized upper abdomen. Musculoskeletal/Chest wall: No chest  wall mass or evidence of osseous metastatic disease. Subacute appearing fracture of the sternal manubrium is grossly unchanged from recent PET-CT, although most obvious on the reformatted images. There are degenerative changes within the thoracic spine. Unless specific follow-up recommendations are mentioned in the findings or impression sections, no imaging follow-up of any mentioned incidental findings is recommended. IMPRESSION: 1. Imaging for bronchoscopy planning and guidance. 2. Slight interval enlargement of the dominant spiculated left upper lobe mass, consistent with primary bronchogenic carcinoma. 3. Grossly stable small AP window and hilar lymph nodes, hypermetabolic on PET-CT, suspicious for metastatic disease. 4. No other evidence of metastatic disease. 5. Subacute appearing fracture of the sternal manubrium, grossly unchanged from recent PET-CT. 6.  Aortic Atherosclerosis (ICD10-I70.0). Electronically Signed   By: Carey Bullocks M.D.   On: 03/02/2023 16:19    ASSESSMENT: This is a very pleasant 62 years old white female recently diagnosed with a stage IIIa (T2a, N2, M0) non-small cell lung cancer, with unknown subtype secondary to insufficient tissue presented with left upper lobe lung mass in addition to left hilar and AP window lymphadenopathy diagnosed in October 2024.   PLAN: I had a lengthy discussion with the patient and her daughter today about her current disease stage, prognosis and treatment options.  I personally and independently reviewed the imaging studies and discussed the result with the patient and her daughter. Assessment and Plan    Non-Small Cell Lung Cancer (NSCLC) Stage IIIA (T2a, N2, M0) NSCLC involving left upper lobe mass, left hilar lymph node, and AP window lymph node. Biopsy confirmed non-small cell type, insufficient material for specific subtype. No small cell lung cancer. Asymptomatic except for nocturnal cough. Significant smoking history (48 years, currently  reducing). Chemo and radiation chosen due to inoperability of lymph nodes. Patient prefers prompt treatment. Discussed risks (hair loss, nausea, vomiting, blood count drop), benefits (enhanced radiation efficacy), and alternatives (surgery not an option). - Administer carboplatin for AUC of 2 and paclitaxel 45 mg/m2 chemotherapy weekly for six and a half weeks - Coordinate concurrent radiation therapy five days a week for six and a half weeks - Monitor blood counts and chemistry weekly - Prescribe anti-nausea medication (send to [facility] in [place]) - Schedule post-treatment imaging to assess response - Consider immunotherapy post-chemo/radiation if imaging shows good response - Educate on chemotherapy side effects - Advise avoiding contact with pregnant women and children using the same bathroom during chemotherapy - Arrange chemo class before starting treatment - Start treatment on March 30, 2023  Lung Nodule Initial lung nodule identified 8-10 years ago, initially non-concerning. Recent CT in May 2024 revealed nodule with lymphadenopathy, leading to NSCLC diagnosis. - Monitor as part of NSCLC treatment plan  Possible Cecal Lesion CT scan indicated a spot in the cecum, differential includes stool, bolus, or other pathology. No recent colonoscopy performed. - Refer to GI for evaluation and possible colonoscopy  General Health Maintenance No colonoscopy despite being 62 years old. No significant medical history apart from COPD and left knee surgery. Family history includes bladder cancer in father and heart disease in mother. - Encourage smoking cessation - Schedule colonoscopy with Dr. Lorin Mercy  Follow-up - Schedule post-treatment imaging - Arrange follow-up appointments biweekly during treatment to monitor progress.   The patient was advised to call  immediately if she has any other concerning symptoms in the interval. The patient voices understanding of current disease status and treatment  options and is in agreement with the current care plan.  All questions were answered. The patient knows to call the clinic with any problems, questions or concerns. We can certainly see the patient much sooner if necessary.  Thank you so much for allowing me to participate in the care of [employer]. I will continue to follow up the patient with you and assist in her care.  The total time spent in the appointment was 90 minutes.  Disclaimer: This note was dictated with voice recognition software. Similar sounding words can inadvertently be transcribed and may not be corrected upon review.   Lajuana Matte March 19, 2023, 2:24 PM

## 2023-03-20 ENCOUNTER — Encounter: Payer: Self-pay | Admitting: Internal Medicine

## 2023-03-20 MED ORDER — PROCHLORPERAZINE MALEATE 10 MG PO TABS: 10.0000 mg | ORAL_TABLET | Freq: Four times a day (QID) | ORAL | 1 refills | Status: DC | PRN

## 2023-03-20 NOTE — Progress Notes (Signed)
Pt notifed of date/time/location of Brain MRI. 11/21 @ 2:00 at [facility]. Arrival time of 1:30. Use Entrance A. Pt verbalized understanding of information provided.  Pt also provided with number to preservice center [phone] to get more information as to what she may have to pay OOP for the MRI since she is currently without insurance.

## 2023-03-20 NOTE — Progress Notes (Signed)
START ON PATHWAY REGIMEN - Non-Small Cell Lung     A cycle is every 7 days, concurrent with RT:     Paclitaxel      Carboplatin   **Always confirm dose/schedule in your pharmacy ordering system**  Patient Characteristics: Preoperative or Nonsurgical Candidate (Clinical Staging), Stage III - Nonsurgical Candidate (Nonsquamous and Squamous), PS = 0,1, EGFR Mutation - Common (Exon 19 Deletion or Exon 21 L858R Substitution) Negative Therapeutic Status: Preoperative or Nonsurgical Candidate (Clinical Staging) AJCC T Category: cT2a AJCC N Category: cN2 AJCC M Category: cM0 AJCC 8 Stage Grouping: IIIA ECOG Performance Status: 1 EGFR Mutation - Common (Exon 19 Deletion or Exon 21 L858R Substitution): Negative Intent of Therapy: Curative Intent, Discussed with Patient

## 2023-03-20 NOTE — Progress Notes (Signed)
I met the pt face to face today at her medical oncology appt with Dr.Mohamed. Pt was accompanied by her daughter, Debra Lutz. Plan for pt is for her to start concurrent chemoradiation the week of 11/25. Pt requires a MRI to complete her staging work up. Pt lives in [place], [place]. Pt states she will have family helping her with her transportation needs. Reinterated the importance of stopping smoking and pt verbalized understanding. She states that she had only had 3 cigarettes today. Encouragement provided. No other barriers at this time. I provided my business card with my direct number on it and encouraged her to call me with any questions or concerns.

## 2023-03-22 ENCOUNTER — Other Ambulatory Visit: Payer: Self-pay

## 2023-03-23 ENCOUNTER — Ambulatory Visit
Admission: RE | Admit: 2023-03-23 | Discharge: 2023-03-23 | Disposition: A | Discharge status: Home / Self Care | Payer: Self-pay | Source: Ambulatory Visit | Attending: Radiation Oncology | Admitting: Radiation Oncology

## 2023-03-23 DIAGNOSIS — F17210 Nicotine dependence, cigarettes, uncomplicated: Secondary | ICD-10-CM | POA: Insufficient documentation

## 2023-03-23 DIAGNOSIS — C778 Secondary and unspecified malignant neoplasm of lymph nodes of multiple regions: Secondary | ICD-10-CM | POA: Insufficient documentation

## 2023-03-23 DIAGNOSIS — Z510 Encounter for antineoplastic radiation therapy: Secondary | ICD-10-CM | POA: Insufficient documentation

## 2023-03-23 DIAGNOSIS — C3412 Malignant neoplasm of upper lobe, left bronchus or lung: Secondary | ICD-10-CM | POA: Insufficient documentation

## 2023-03-25 ENCOUNTER — Encounter: Payer: Self-pay | Admitting: Internal Medicine

## 2023-03-26 ENCOUNTER — Encounter: Payer: Self-pay | Admitting: Internal Medicine

## 2023-03-26 ENCOUNTER — Ambulatory Visit (HOSPITAL_COMMUNITY)
Admission: RE | Admit: 2023-03-26 | Discharge: 2023-03-26 | Disposition: A | Discharge status: Home / Self Care | Payer: Self-pay | Source: Ambulatory Visit | Attending: Radiation Oncology | Admitting: Radiation Oncology

## 2023-03-26 DIAGNOSIS — C3412 Malignant neoplasm of upper lobe, left bronchus or lung: Secondary | ICD-10-CM | POA: Insufficient documentation

## 2023-03-26 MED ORDER — GADOBUTROL 1 MMOL/ML IV SOLN
7.0000 mL | Freq: Once | INTRAVENOUS | Status: AC | PRN
  Administered 2023-03-26: 7 mL via INTRAVENOUS

## 2023-03-26 NOTE — Progress Notes (Signed)
I reached out to the pt to review her upcoming appts, her education appt on Monday 11/25 at 9am and her lab and first infusion appt on 11/26. I asked the pt if she was still interested in a port, pt asked if she should, I reminded her that she said her veins were good and if they remain easy to access, she may not need a port. However, if it becomes difficult to start and IV on her, a port can be placed at a later time. Pt verbalized agreement. Encouraged pt to call me with any questions.

## 2023-03-30 ENCOUNTER — Other Ambulatory Visit: Payer: Self-pay

## 2023-03-30 ENCOUNTER — Ambulatory Visit
Admission: RE | Admit: 2023-03-30 | Discharge: 2023-03-30 | Disposition: A | Discharge status: Home / Self Care | Payer: Self-pay | Source: Ambulatory Visit | Attending: Radiation Oncology | Admitting: Radiation Oncology

## 2023-03-30 ENCOUNTER — Inpatient Hospital Stay: Payer: Self-pay

## 2023-03-30 DIAGNOSIS — C3412 Malignant neoplasm of upper lobe, left bronchus or lung: Secondary | ICD-10-CM

## 2023-03-30 LAB — RAD ONC ARIA SESSION SUMMARY
Course Elapsed Days: 0
Plan Fractions Treated to Date: 1
Plan Prescribed Dose Per Fraction: 2 Gy
Plan Total Fractions Prescribed: 30
Plan Total Prescribed Dose: 60 Gy
Reference Point Dosage Given to Date: 2 Gy
Reference Point Session Dosage Given: 2 Gy
Session Number: 1

## 2023-03-31 ENCOUNTER — Ambulatory Visit
Admission: RE | Admit: 2023-03-31 | Discharge: 2023-03-31 | Disposition: A | Discharge status: Home / Self Care | Payer: Self-pay | Source: Ambulatory Visit | Attending: Radiation Oncology | Admitting: Radiation Oncology

## 2023-03-31 ENCOUNTER — Other Ambulatory Visit: Payer: Self-pay

## 2023-03-31 ENCOUNTER — Inpatient Hospital Stay: Payer: Self-pay

## 2023-03-31 VITALS — BP 134/76 | HR 65 | Temp 97.9°F | Resp 16 | Ht 63.0 in | Wt 167.0 lb

## 2023-03-31 DIAGNOSIS — C3412 Malignant neoplasm of upper lobe, left bronchus or lung: Secondary | ICD-10-CM

## 2023-03-31 LAB — CBC WITH DIFFERENTIAL (CANCER CENTER ONLY)
Abs Immature Granulocytes: 0.01 10*3/uL (ref 0.00–0.07)
Basophils Absolute: 0 10*3/uL (ref 0.0–0.1)
Basophils Relative: 1 %
Eosinophils Absolute: 0.1 10*3/uL (ref 0.0–0.5)
Eosinophils Relative: 1 %
HCT: 43.3 % (ref 36.0–46.0)
Hemoglobin: 14.4 g/dL (ref 12.0–15.0)
Immature Granulocytes: 0 %
Lymphocytes Relative: 25 %
Lymphs Abs: 1.4 10*3/uL (ref 0.7–4.0)
MCH: 30 pg (ref 26.0–34.0)
MCHC: 33.3 g/dL (ref 30.0–36.0)
MCV: 90.2 fL (ref 80.0–100.0)
Monocytes Absolute: 0.4 10*3/uL (ref 0.1–1.0)
Monocytes Relative: 7 %
Neutro Abs: 3.7 10*3/uL (ref 1.7–7.7)
Neutrophils Relative %: 66 %
Platelet Count: 278 10*3/uL (ref 150–400)
RBC: 4.8 MIL/uL (ref 3.87–5.11)
RDW: 12 % (ref 11.5–15.5)
WBC Count: 5.5 10*3/uL (ref 4.0–10.5)
nRBC: 0 % (ref 0.0–0.2)

## 2023-03-31 LAB — RAD ONC ARIA SESSION SUMMARY
Course Elapsed Days: 1
Plan Fractions Treated to Date: 2
Plan Prescribed Dose Per Fraction: 2 Gy
Plan Total Fractions Prescribed: 30
Plan Total Prescribed Dose: 60 Gy
Reference Point Dosage Given to Date: 4 Gy
Reference Point Session Dosage Given: 2 Gy
Session Number: 2

## 2023-03-31 LAB — CMP (CANCER CENTER ONLY)
ALT: 7 U/L (ref 0–44)
AST: 9 U/L — ABNORMAL LOW (ref 15–41)
Albumin: 4.4 g/dL (ref 3.5–5.0)
Alkaline Phosphatase: 92 U/L (ref 38–126)
Anion gap: 7 (ref 5–15)
BUN: 11 mg/dL (ref 8–23)
CO2: 28 mmol/L (ref 22–32)
Calcium: 9.5 mg/dL (ref 8.9–10.3)
Chloride: 105 mmol/L (ref 98–111)
Creatinine: 0.77 mg/dL (ref 0.44–1.00)
GFR, Estimated: >60 mL/min (ref >60)
Glucose, Bld: 104 mg/dL — ABNORMAL HIGH (ref 70–99)
Potassium: 3.9 mmol/L (ref 3.5–5.1)
Sodium: 140 mmol/L (ref 135–145)
Total Bilirubin: 0.5 mg/dL (ref <1.2)
Total Protein: 7.4 g/dL (ref 6.5–8.1)

## 2023-03-31 MED ORDER — SODIUM CHLORIDE 0.9 % IV SOLN
10.0000 mg | Freq: Once | INTRAVENOUS | Status: DC
Start: 2023-03-31 — End: 2023-03-31

## 2023-03-31 MED ORDER — DEXAMETHASONE SODIUM PHOSPHATE 10 MG/ML IJ SOLN
10.0000 mg | Freq: Once | INTRAMUSCULAR | Status: AC
  Administered 2023-03-31: 10 mg via INTRAVENOUS
  Filled 2023-03-31: qty 1

## 2023-03-31 MED ORDER — PALONOSETRON HCL INJECTION 0.25 MG/5ML
0.2500 mg | Freq: Once | INTRAVENOUS | Status: AC
  Administered 2023-03-31: 0.25 mg via INTRAVENOUS
  Filled 2023-03-31: qty 5

## 2023-03-31 MED ORDER — SONAFINE EX EMUL
1.0000 | Freq: Once | CUTANEOUS | Status: AC
  Administered 2023-03-31: 1 via TOPICAL

## 2023-03-31 MED ORDER — SODIUM CHLORIDE 0.9 % IV SOLN: INTRAVENOUS | Status: DC

## 2023-03-31 MED ORDER — SODIUM CHLORIDE 0.9 % IV SOLN
45.0000 mg/m2 | Freq: Once | INTRAVENOUS | Status: AC
  Administered 2023-03-31: 84 mg via INTRAVENOUS
  Filled 2023-03-31: qty 14

## 2023-03-31 MED ORDER — DIPHENHYDRAMINE HCL 50 MG/ML IJ SOLN
50.0000 mg | Freq: Once | INTRAMUSCULAR | Status: AC
  Administered 2023-03-31: 50 mg via INTRAVENOUS
  Filled 2023-03-31: qty 1

## 2023-03-31 MED ORDER — SODIUM CHLORIDE 0.9 % IV SOLN
222.2000 mg | Freq: Once | INTRAVENOUS | Status: AC
  Administered 2023-03-31: 220 mg via INTRAVENOUS
  Filled 2023-03-31: qty 22

## 2023-03-31 MED ORDER — FAMOTIDINE IN NACL 20-0.9 MG/50ML-% IV SOLN
20.0000 mg | Freq: Once | INTRAVENOUS | Status: AC
  Administered 2023-03-31: 20 mg via INTRAVENOUS
  Filled 2023-03-31: qty 50

## 2023-03-31 NOTE — Patient Instructions (Signed)
[facility] - A DEPT OF [facility]  Discharge Instructions: Thank you for choosing [facility] to provide your oncology and hematology care.   If you have a lab appointment with the Cancer Center, please go directly to the Cancer Center and check in at the registration area.   Wear comfortable clothing and clothing appropriate for easy access to any Portacath or PICC line.   We strive to give you quality time with your provider. You may need to reschedule your appointment if you arrive late (15 or more minutes).  Arriving late affects you and other patients whose appointments are after yours.  Also, if you miss three or more appointments without notifying the office, you may be dismissed from the clinic at the provider's discretion.      For prescription refill requests, have your pharmacy contact our office and allow 72 hours for refills to be completed.    Today you received the following chemotherapy and/or immunotherapy agents: Paclitaxel (Taxol) and Carboplatin.      To help prevent nausea and vomiting after your treatment, we encourage you to take your nausea medication as directed.  BELOW ARE SYMPTOMS THAT SHOULD BE REPORTED IMMEDIATELY: *FEVER GREATER THAN 100.4 F (38 C) OR HIGHER *CHILLS OR SWEATING *NAUSEA AND VOMITING THAT IS NOT CONTROLLED WITH YOUR NAUSEA MEDICATION *UNUSUAL SHORTNESS OF BREATH *UNUSUAL BRUISING OR BLEEDING *URINARY PROBLEMS (pain or burning when urinating, or frequent urination) *BOWEL PROBLEMS (unusual diarrhea, constipation, pain near the anus) TENDERNESS IN MOUTH AND THROAT WITH OR WITHOUT PRESENCE OF ULCERS (sore throat, sores in mouth, or a toothache) UNUSUAL RASH, SWELLING OR PAIN  UNUSUAL VAGINAL DISCHARGE OR ITCHING   Items with * indicate a potential emergency and should be followed up as soon as possible or go to the Emergency Department if any problems should occur.  Please show the CHEMOTHERAPY  ALERT CARD or IMMUNOTHERAPY ALERT CARD at check-in to the Emergency Department and triage nurse.  Should you have questions after your visit or need to cancel or reschedule your appointment, please contact [facility] - A DEPT OF [facility]  Dept: [phone]  and follow the prompts.  Office hours are 8:00 a.m. to 4:30 p.m. Monday - Friday. Please note that voicemails left after 4:00 p.m. may not be returned until the following business day.  We are closed weekends and major holidays. You have access to a nurse at all times for urgent questions. Please call the main number to the clinic Dept: [phone] and follow the prompts.   For any non-urgent questions, you may also contact your provider using MyChart. We now offer e-Visits for anyone 66 and older to request care online for non-urgent symptoms. For details visit [email or web address].   Also download the MyChart app! Go to the app store, search "MyChart", open the app, select , and log in with your MyChart username and password.

## 2023-04-01 ENCOUNTER — Telehealth: Payer: Self-pay | Admitting: Medical Oncology

## 2023-04-01 ENCOUNTER — Ambulatory Visit
Admission: RE | Admit: 2023-04-01 | Discharge: 2023-04-01 | Disposition: A | Discharge status: Home / Self Care | Payer: Self-pay | Source: Ambulatory Visit | Attending: Radiation Oncology | Admitting: Radiation Oncology

## 2023-04-01 ENCOUNTER — Encounter (HOSPITAL_COMMUNITY): Payer: Self-pay

## 2023-04-01 ENCOUNTER — Telehealth: Payer: Self-pay

## 2023-04-01 ENCOUNTER — Other Ambulatory Visit: Payer: Self-pay

## 2023-04-01 LAB — RAD ONC ARIA SESSION SUMMARY
Course Elapsed Days: 2
Plan Fractions Treated to Date: 3
Plan Prescribed Dose Per Fraction: 2 Gy
Plan Total Fractions Prescribed: 30
Plan Total Prescribed Dose: 60 Gy
Reference Point Dosage Given to Date: 6 Gy
Reference Point Session Dosage Given: 2 Gy
Session Number: 3

## 2023-04-01 NOTE — Telephone Encounter (Signed)
F/u chemotherapy treatment call. She woke up with her face is flushed and no other symptoms.  " I feel fine".

## 2023-04-01 NOTE — Telephone Encounter (Signed)
-----   Message from Nurse Selena Batten sent at 03/31/2023  3:51 PM EST ----- Regarding: First Time Taxol and Carboplatin - Dr. Arbutus Ped Patient First Time Taxol and Carboplatin - Dr. Arbutus Ped Patient Patient tolerated treatment well.

## 2023-04-01 NOTE — Telephone Encounter (Signed)
LM for patient that this nurse was calling to see how they were doing after their treatment. Please call back to Dr. Asa Lente nurse at [phone] if they have any questions or concerns regarding the treatment.

## 2023-04-05 NOTE — Progress Notes (Unsigned)
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 Biopsy confirmed non-small cell type, insufficient material for specific subtype. No small cell lung cancer. Asymptomatic except for nocturnal cough. Significant smoking history (48 years, currently reducing). Chemo and radiation chosen due to inoperability of lymph nodes. Patient prefers prompt treatment. Discussed risks (hair loss, nausea, vomiting, blood count drop), benefits (enhanced radiation efficacy), and alternatives (surgery not an option). - Administer carboplatin for AUC of 2 and paclitaxel 45 mg/m2 chemotherapy weekly for six and a half weeks -Concurrent radiation therapy five days a week for six and a half weeks - Consider immunotherapy post-chemo/radiation if imaging shows good response - Start treatment on March 30, 2023 -Cycle 2 Day 1 is scheduled for 04/08/2023 -she is taking daily radiation treatments   Plan: Labs reviewed  -CBC showing WBC 4.9; Hgb 13.8; Hct 41.6; Plt's 257; Anc 3.6 -CMP - K 4.0; glucose 95; BUN 13; Creatinine 0.76; eGFR > 60; Ca 9.6; LFTs normal.   Patient has had MRI of the head ordered by Dr. Roselind Messier.  Results pending. Clinically, patient doing very well with minimal side effects. Proceed with daily radiation treatments. Cycle 2 day 1 paclitaxel scheduled for tomorrow.  Proceed as planned. Labs, follow-up, and treatments as currently scheduled.    The patient understands the plans discussed today and is in agreement with them.  She knows to contact our office if she develops concerns prior to her next appointment.  I provided 25 minutes of face-to-face  time during this encounter and > 50% was spent counseling as documented under my assessment and plan.    Carlean Jews, NP  [facility] - A DEPT [facility] [address] [place] [address] Dept: [phone] Dept Fax: [phone]   No orders of the defined types were placed in this encounter.     CHIEF COMPLAINT:  CC: Primary cancer of left upper lobe of the lung  Current Treatment:  carboplatin for AUC of 2 and paclitaxel 45 mg/m2 chemotherapy weekly for six and a half weeks -concurrent radiation therapy five days a week for six and a half weeks  INTERVAL HISTORY:  Debra Lutz is here today for repeat clinical assessment.  She was last seen by Dr. Shirline Frees on 03/19/2023.  Started chemotherapy on 03/30/2023. Getting weekly paclitaxel and carboplatin and tolerating very well. Having concurrent radiation daily. She is set up to have radiation through May 14, 2023.  She did have MRI of the brain on 03/26/2023.  Results are pending. She denies chest pain, chest pressure, or shortness of breath. She denies headaches or visual disturbances. He denies abdominal pain, nausea, vomiting, or changes in bowel or bladder habits.  Her appetite is good. Her weight has been stable.  I have reviewed the past medical history, past surgical history, social history and family history with the patient and they are unchanged from previous note.  ALLERGIES:  is allergic to wixela inhub [fluticasone-salmeterol].  MEDICATIONS:  Current Outpatient Medications  Medication Sig Dispense Refill   albuterol (ACCUNEB) 0.63 MG/3ML nebulizer solution Take 1 ampule by nebulization every 6 (six)  hours as needed for wheezing.     albuterol (VENTOLIN HFA) 108 (90 Base) MCG/ACT inhaler Inhale into the lungs every 6 (six) hours as needed for wheezing or shortness of breath. Using at night     fexofenadine (ALLEGRA ALLERGY) 180 MG tablet Take 180 mg  by mouth daily.     ibuprofen (ADVIL) 200 MG tablet Take 200 mg by mouth every 4 (four) hours as needed for mild pain (pain score 1-3).     Multiple Vitamins-Minerals (MULTIVITAMINS THER. W/MINERALS) TABS tablet Take 1 tablet by mouth daily.     prochlorperazine (COMPAZINE) 10 MG tablet Take 1 tablet (10 mg total) by mouth every 6 (six) hours as needed for nausea or vomiting. 30 tablet 1   No current facility-administered medications for this visit.    HISTORY OF PRESENT ILLNESS:   Oncology History  Primary cancer of left upper lobe of lung (HCC)  03/18/2023 Initial Diagnosis   Primary cancer of left upper lobe of lung (HCC)   03/18/2023 Cancer Staging   Staging form: Lung, AJCC 8th Edition - Clinical stage from 03/18/2023: Stage IIIA (cT2a, cN2, cM0) - Signed by Antony Blackbird, MD on 03/18/2023 Histopathologic type: Carcinoma, NOS Stage prefix: Initial diagnosis   03/31/2023 -  Chemotherapy   Patient is on Treatment Plan : LUNG Carboplatin + Paclitaxel + XRT q7d         REVIEW OF SYSTEMS:   Constitutional: Denies fevers, chills or abnormal weight loss Eyes: Denies blurriness of vision Ears, nose, mouth, throat, and face: Denies mucositis or sore throat Respiratory: Denies dyspnea or wheezes. States that cough has been improving since starting chemo and radiation.  Cardiovascular: Denies palpitation, chest discomfort or lower extremity swelling Gastrointestinal:  Denies nausea, heartburn or change in bowel habits Skin: Denies abnormal skin rashes Lymphatics: Denies new lymphadenopathy or easy bruising Neurological:Denies numbness, tingling or new weaknesses Behavioral/Psych: Mood is stable, no new changes  All other systems were reviewed with the patient and are negative.   VITALS:   Today's Vitals   04/07/23 0926  BP: 121/70  Pulse: 65  Resp: 17  Temp: 98 F (36.7 C)  TempSrc: Temporal  SpO2: 98%  Weight: 168 lb 8 oz (76.4 kg)   Body mass index is 29.85 kg/m.   Wt Readings from Last 3 Encounters:  04/07/23 168 lb 8 oz (76.4 kg)  03/31/23 167 lb (75.8 kg)  03/19/23 169 lb (76.7 kg)    Body mass index is 29.85 kg/m.  Performance status (ECOG): 1 - Symptomatic but completely ambulatory  PHYSICAL EXAM:   GENERAL:alert, no distress and comfortable SKIN: skin color, texture, turgor are normal, no rashes or significant lesions EYES: normal, Conjunctiva are pink and non-injected, sclera clear OROPHARYNX:no exudate, no erythema and lips, buccal mucosa, and tongue normal  NECK: supple, thyroid normal size, non-tender, without nodularity LYMPH:  no palpable lymphadenopathy in the cervical, axillary or inguinal LUNGS: clear to auscultation and percussion with normal breathing effort HEART: regular rate & rhythm and no murmurs and no lower extremity edema ABDOMEN:abdomen soft, non-tender and normal bowel sounds Musculoskeletal:no cyanosis of digits and no clubbing  NEURO: alert & oriented x 3 with fluent speech, no focal motor/sensory deficits  LABORATORY DATA:  I have reviewed the data as listed    Component Value Date/Time   NA 140 04/07/2023 0903   K 4.0 04/07/2023 0903   CL 102 04/07/2023 0903   CO2 32 04/07/2023 0903   GLUCOSE 95 04/07/2023 0903   BUN 13  04/07/2023 0903   CREATININE 0.76 04/07/2023 0903   CALCIUM 9.6 04/07/2023 0903   PROT 6.9 04/07/2023 0903   ALBUMIN 4.3 04/07/2023 0903   AST 9 (L) 04/07/2023 0903   ALT 13 04/07/2023 0903   ALKPHOS 84 04/07/2023 0903   BILITOT 0.5 04/07/2023 0903   GFRNONAA >60 04/07/2023 0903    Lab Results  Component Value Date   WBC 4.9 04/07/2023   NEUTROABS 3.6 04/07/2023   HGB 13.8 04/07/2023   HCT 41.6 04/07/2023   MCV 90.8 04/07/2023   PLT 257 04/07/2023

## 2023-04-05 NOTE — Assessment & Plan Note (Signed)
Stage IIIA (T2a, N2, M0) NSCLC involving left upper lobe mass, left hilar lymph node, and AP window lymph node. Biopsy confirmed non-small cell type, insufficient material for specific subtype. No small cell lung cancer. Asymptomatic except for nocturnal cough. Significant smoking history (48 years, currently reducing). Chemo and radiation chosen due to inoperability of lymph nodes. Patient prefers prompt treatment. Discussed risks (hair loss, nausea, vomiting, blood count drop), benefits (enhanced radiation efficacy), and alternatives (surgery not an option). - Administer carboplatin for AUC of 2 and paclitaxel 45 mg/m2 chemotherapy weekly for six and a half weeks -Concurrent radiation therapy five days a week for six and a half weeks - Consider immunotherapy post-chemo/radiation if imaging shows good response - Start treatment on March 30, 2023 -Today begin cycle 2 Day 1

## 2023-04-06 ENCOUNTER — Ambulatory Visit
Admission: RE | Admit: 2023-04-06 | Discharge: 2023-04-06 | Disposition: A | Discharge status: Home / Self Care | Payer: Self-pay | Source: Ambulatory Visit | Attending: Radiation Oncology | Admitting: Radiation Oncology

## 2023-04-06 ENCOUNTER — Other Ambulatory Visit: Payer: Self-pay

## 2023-04-06 DIAGNOSIS — F17210 Nicotine dependence, cigarettes, uncomplicated: Secondary | ICD-10-CM | POA: Insufficient documentation

## 2023-04-06 DIAGNOSIS — Z510 Encounter for antineoplastic radiation therapy: Secondary | ICD-10-CM | POA: Insufficient documentation

## 2023-04-06 DIAGNOSIS — C3412 Malignant neoplasm of upper lobe, left bronchus or lung: Secondary | ICD-10-CM | POA: Insufficient documentation

## 2023-04-06 DIAGNOSIS — C778 Secondary and unspecified malignant neoplasm of lymph nodes of multiple regions: Secondary | ICD-10-CM | POA: Insufficient documentation

## 2023-04-06 LAB — RAD ONC ARIA SESSION SUMMARY
Course Elapsed Days: 7
Plan Fractions Treated to Date: 4
Plan Prescribed Dose Per Fraction: 2 Gy
Plan Total Fractions Prescribed: 30
Plan Total Prescribed Dose: 60 Gy
Reference Point Dosage Given to Date: 8 Gy
Reference Point Session Dosage Given: 2 Gy
Session Number: 4

## 2023-04-07 ENCOUNTER — Inpatient Hospital Stay: Payer: Self-pay | Attending: Internal Medicine

## 2023-04-07 ENCOUNTER — Other Ambulatory Visit: Payer: Self-pay

## 2023-04-07 ENCOUNTER — Ambulatory Visit
Admission: RE | Admit: 2023-04-07 | Discharge: 2023-04-07 | Disposition: A | Discharge status: Home / Self Care | Payer: Self-pay | Source: Ambulatory Visit | Attending: Radiation Oncology | Admitting: Radiation Oncology

## 2023-04-07 ENCOUNTER — Encounter: Payer: Self-pay | Admitting: Nurse Practitioner

## 2023-04-07 ENCOUNTER — Inpatient Hospital Stay (HOSPITAL_BASED_OUTPATIENT_CLINIC_OR_DEPARTMENT_OTHER): Payer: Self-pay | Admitting: Nurse Practitioner

## 2023-04-07 VITALS — BP 121/70 | HR 65 | Temp 98.0°F | Resp 17 | Wt 168.5 lb

## 2023-04-07 DIAGNOSIS — C3412 Malignant neoplasm of upper lobe, left bronchus or lung: Secondary | ICD-10-CM

## 2023-04-07 DIAGNOSIS — Z79899 Other long term (current) drug therapy: Secondary | ICD-10-CM | POA: Insufficient documentation

## 2023-04-07 DIAGNOSIS — F17210 Nicotine dependence, cigarettes, uncomplicated: Secondary | ICD-10-CM | POA: Insufficient documentation

## 2023-04-07 DIAGNOSIS — Z5111 Encounter for antineoplastic chemotherapy: Secondary | ICD-10-CM | POA: Insufficient documentation

## 2023-04-07 LAB — CBC WITH DIFFERENTIAL (CANCER CENTER ONLY)
Abs Immature Granulocytes: 0.03 10*3/uL (ref 0.00–0.07)
Basophils Absolute: 0 10*3/uL (ref 0.0–0.1)
Basophils Relative: 1 %
Eosinophils Absolute: 0.1 10*3/uL (ref 0.0–0.5)
Eosinophils Relative: 1 %
HCT: 41.6 % (ref 36.0–46.0)
Hemoglobin: 13.8 g/dL (ref 12.0–15.0)
Immature Granulocytes: 1 %
Lymphocytes Relative: 19 %
Lymphs Abs: 0.9 10*3/uL (ref 0.7–4.0)
MCH: 30.1 pg (ref 26.0–34.0)
MCHC: 33.2 g/dL (ref 30.0–36.0)
MCV: 90.8 fL (ref 80.0–100.0)
Monocytes Absolute: 0.3 10*3/uL (ref 0.1–1.0)
Monocytes Relative: 5 %
Neutro Abs: 3.6 10*3/uL (ref 1.7–7.7)
Neutrophils Relative %: 73 %
Platelet Count: 257 10*3/uL (ref 150–400)
RBC: 4.58 MIL/uL (ref 3.87–5.11)
RDW: 12 % (ref 11.5–15.5)
WBC Count: 4.9 10*3/uL (ref 4.0–10.5)
nRBC: 0 % (ref 0.0–0.2)

## 2023-04-07 LAB — RAD ONC ARIA SESSION SUMMARY
Course Elapsed Days: 8
Plan Fractions Treated to Date: 5
Plan Prescribed Dose Per Fraction: 2 Gy
Plan Total Fractions Prescribed: 30
Plan Total Prescribed Dose: 60 Gy
Reference Point Dosage Given to Date: 10 Gy
Reference Point Session Dosage Given: 2 Gy
Session Number: 5

## 2023-04-07 LAB — CMP (CANCER CENTER ONLY)
ALT: 13 U/L (ref 0–44)
AST: 9 U/L — ABNORMAL LOW (ref 15–41)
Albumin: 4.3 g/dL (ref 3.5–5.0)
Alkaline Phosphatase: 84 U/L (ref 38–126)
Anion gap: 6 (ref 5–15)
BUN: 13 mg/dL (ref 8–23)
CO2: 32 mmol/L (ref 22–32)
Calcium: 9.6 mg/dL (ref 8.9–10.3)
Chloride: 102 mmol/L (ref 98–111)
Creatinine: 0.76 mg/dL (ref 0.44–1.00)
GFR, Estimated: >60 mL/min (ref >60)
Glucose, Bld: 95 mg/dL (ref 70–99)
Potassium: 4 mmol/L (ref 3.5–5.1)
Sodium: 140 mmol/L (ref 135–145)
Total Bilirubin: 0.5 mg/dL (ref <1.2)
Total Protein: 6.9 g/dL (ref 6.5–8.1)

## 2023-04-08 ENCOUNTER — Inpatient Hospital Stay: Payer: Self-pay | Admitting: Licensed Clinical Social Worker

## 2023-04-08 ENCOUNTER — Inpatient Hospital Stay: Payer: Self-pay

## 2023-04-08 ENCOUNTER — Other Ambulatory Visit: Payer: Self-pay

## 2023-04-08 ENCOUNTER — Ambulatory Visit
Admission: RE | Admit: 2023-04-08 | Discharge: 2023-04-08 | Disposition: A | Discharge status: Home / Self Care | Payer: Self-pay | Source: Ambulatory Visit | Attending: Radiation Oncology | Admitting: Radiation Oncology

## 2023-04-08 ENCOUNTER — Encounter: Payer: Self-pay | Admitting: Internal Medicine

## 2023-04-08 VITALS — BP 122/65 | HR 84 | Temp 97.8°F | Resp 17

## 2023-04-08 DIAGNOSIS — C3412 Malignant neoplasm of upper lobe, left bronchus or lung: Secondary | ICD-10-CM

## 2023-04-08 LAB — RAD ONC ARIA SESSION SUMMARY
Course Elapsed Days: 9
Plan Fractions Treated to Date: 6
Plan Prescribed Dose Per Fraction: 2 Gy
Plan Total Fractions Prescribed: 30
Plan Total Prescribed Dose: 60 Gy
Reference Point Dosage Given to Date: 12 Gy
Reference Point Session Dosage Given: 2 Gy
Session Number: 6

## 2023-04-08 MED ORDER — SODIUM CHLORIDE 0.9 % IV SOLN
45.0000 mg/m2 | Freq: Once | INTRAVENOUS | Status: AC
  Administered 2023-04-08: 84 mg via INTRAVENOUS
  Filled 2023-04-08: qty 14

## 2023-04-08 MED ORDER — SODIUM CHLORIDE 0.9 % IV SOLN
INTRAVENOUS | Status: DC
Start: 2023-04-08 — End: 2023-04-08

## 2023-04-08 MED ORDER — DIPHENHYDRAMINE HCL 50 MG/ML IJ SOLN
50.0000 mg | Freq: Once | INTRAMUSCULAR | Status: AC
Start: 2023-04-08 — End: 2023-04-08
  Administered 2023-04-08: 50 mg via INTRAVENOUS
  Filled 2023-04-08: qty 1

## 2023-04-08 MED ORDER — FAMOTIDINE IN NACL 20-0.9 MG/50ML-% IV SOLN
20.0000 mg | Freq: Once | INTRAVENOUS | Status: AC
  Administered 2023-04-08: 20 mg via INTRAVENOUS
  Filled 2023-04-08: qty 50

## 2023-04-08 MED ORDER — PALONOSETRON HCL INJECTION 0.25 MG/5ML
0.2500 mg | Freq: Once | INTRAVENOUS | Status: AC
  Administered 2023-04-08: 0.25 mg via INTRAVENOUS
  Filled 2023-04-08: qty 5

## 2023-04-08 MED ORDER — SODIUM CHLORIDE 0.9 % IV SOLN
220.0000 mg | Freq: Once | INTRAVENOUS | Status: AC
  Administered 2023-04-08: 220 mg via INTRAVENOUS
  Filled 2023-04-08: qty 22

## 2023-04-08 MED ORDER — DEXAMETHASONE SODIUM PHOSPHATE 10 MG/ML IJ SOLN
10.0000 mg | Freq: Once | INTRAMUSCULAR | Status: AC
  Administered 2023-04-08: 10 mg via INTRAVENOUS
  Filled 2023-04-08: qty 1

## 2023-04-08 NOTE — Patient Instructions (Signed)
[facility] - A DEPT OF [facility]  Discharge Instructions: Thank you for choosing [facility] to provide your oncology and hematology care.   If you have a lab appointment with the Cancer Center, please go directly to the Cancer Center and check in at the registration area.   Wear comfortable clothing and clothing appropriate for easy access to any Portacath or PICC line.   We strive to give you quality time with your provider. You may need to reschedule your appointment if you arrive late (15 or more minutes).  Arriving late affects you and other patients whose appointments are after yours.  Also, if you miss three or more appointments without notifying the office, you may be dismissed from the clinic at the provider's discretion.      For prescription refill requests, have your pharmacy contact our office and allow 72 hours for refills to be completed.    Today you received the following chemotherapy and/or immunotherapy agents: Paclitaxel (Taxol) and Carboplatin       To help prevent nausea and vomiting after your treatment, we encourage you to take your nausea medication as directed.  BELOW ARE SYMPTOMS THAT SHOULD BE REPORTED IMMEDIATELY: *FEVER GREATER THAN 100.4 F (38 C) OR HIGHER *CHILLS OR SWEATING *NAUSEA AND VOMITING THAT IS NOT CONTROLLED WITH YOUR NAUSEA MEDICATION *UNUSUAL SHORTNESS OF BREATH *UNUSUAL BRUISING OR BLEEDING *URINARY PROBLEMS (pain or burning when urinating, or frequent urination) *BOWEL PROBLEMS (unusual diarrhea, constipation, pain near the anus) TENDERNESS IN MOUTH AND THROAT WITH OR WITHOUT PRESENCE OF ULCERS (sore throat, sores in mouth, or a toothache) UNUSUAL RASH, SWELLING OR PAIN  UNUSUAL VAGINAL DISCHARGE OR ITCHING   Items with * indicate a potential emergency and should be followed up as soon as possible or go to the Emergency Department if any problems should occur.  Please show the CHEMOTHERAPY  ALERT CARD or IMMUNOTHERAPY ALERT CARD at check-in to the Emergency Department and triage nurse.  Should you have questions after your visit or need to cancel or reschedule your appointment, please contact [facility] - A DEPT [facility]  Dept: [phone]  and follow the prompts.  Office hours are 8:00 a.m. to 4:30 p.m. Monday - Friday. Please note that voicemails left after 4:00 p.m. may not be returned until the following business day.  We are closed weekends and major holidays. You have access to a nurse at all times for urgent questions. Please call the main number to the clinic Dept: [phone] and follow the prompts.   For any non-urgent questions, you may also contact your provider using MyChart. We now offer e-Visits for anyone 71 and older to request care online for non-urgent symptoms. For details visit [email or web address].   Also download the MyChart app! Go to the app store, search "MyChart", open the app, select [facility], and log in with your MyChart username and password.

## 2023-04-08 NOTE — Progress Notes (Signed)
[facility] Clinical Social Work  Initial Assessment   Debra Lutz is a 62 y.o. year old female accompanied by patient and daughter. Clinical Social Work was referred by medical provider for assessment of psychosocial needs.   SDOH (Social Determinants of Health) assessments performed: Yes   SDOH Screenings   Food Insecurity: No Food Insecurity (03/18/2023)  Housing: Low Risk  (03/18/2023)  Transportation Needs: No Transportation Needs (03/18/2023)  Utilities: Not At Risk (03/18/2023)  Depression (PHQ2-9): Low Risk  (03/18/2023)  Tobacco Use: High Risk (04/07/2023)     Distress Screen completed: No     No data to display            Family/Social Information:  Housing Arrangement: patient lives with her husband and 69 year old granddaughter Family members/support persons in your life? Family, Friends, and [employer] Financial concerns: no  Employment: Retired pt has applied for early retirement and will begin receiving social security the second week of January.  Income source: pt's husband works on a Advertising account planner concerns:  pt anticipates she will have financial concerns if she is not approved for [employer]. Type of concern: Medical bills Food access concerns: no Religious or spiritual practice: Yes-  Services Currently in place:  none  Coping/ Adjustment to diagnosis: Patient understands treatment plan and what happens next? yes Concerns about diagnosis and/or treatment: How I will pay for the services I need Patient reported stressors: Anxiety/ nervousness and Adjusting to my illness Hopes and/or priorities: pt's priority is to continue treatment w/ the hope of positive results Patient enjoys time with family/ friends Current coping skills/ strengths: Capable of independent living , Motivation for treatment/growth , Physical Health , Religious Affiliation , and Supportive family/friends     SUMMARY: Current SDOH Barriers:  Financial constraints related to lack of  insurance and fixed income.  Clinical Social Work Clinical Goal(s):  Explore community resource options for unmet needs related to:  Financial Strain   Interventions: Discussed common feeling and emotions when being diagnosed with cancer, and the importance of support during treatment Informed patient of the support team roles and support services at [facility] Provided CSW contact information and encouraged patient to call with any questions or concerns Provided patient with information about the [employer] and the guidelines to qualify.  Pt states she has applied for Medicaid and is awaiting approval.  CSW encouraged pt to also apply for food stamps and SSI.  Pt will reach out to CSW to provide an update on Medicaid once approval status is determined.     Follow Up Plan: Patient will contact CSW with any support or resource needs Patient verbalizes understanding of plan: Yes    Rachel Moulds, LCSW Clinical Social Worker [facility]

## 2023-04-09 ENCOUNTER — Ambulatory Visit
Admission: RE | Admit: 2023-04-09 | Discharge: 2023-04-09 | Disposition: A | Discharge status: Home / Self Care | Payer: Self-pay | Source: Ambulatory Visit | Attending: Radiation Oncology | Admitting: Radiation Oncology

## 2023-04-09 ENCOUNTER — Other Ambulatory Visit: Payer: Self-pay

## 2023-04-09 LAB — RAD ONC ARIA SESSION SUMMARY
Course Elapsed Days: 10
Plan Fractions Treated to Date: 7
Plan Prescribed Dose Per Fraction: 2 Gy
Plan Total Fractions Prescribed: 30
Plan Total Prescribed Dose: 60 Gy
Reference Point Dosage Given to Date: 14 Gy
Reference Point Session Dosage Given: 2 Gy
Session Number: 7

## 2023-04-10 ENCOUNTER — Ambulatory Visit
Admission: RE | Admit: 2023-04-10 | Discharge: 2023-04-10 | Disposition: A | Discharge status: Home / Self Care | Payer: Self-pay | Source: Ambulatory Visit | Attending: Radiation Oncology | Admitting: Radiation Oncology

## 2023-04-10 ENCOUNTER — Other Ambulatory Visit: Payer: Self-pay

## 2023-04-10 LAB — RAD ONC ARIA SESSION SUMMARY
Course Elapsed Days: 11
Plan Fractions Treated to Date: 8
Plan Prescribed Dose Per Fraction: 2 Gy
Plan Total Fractions Prescribed: 30
Plan Total Prescribed Dose: 60 Gy
Reference Point Dosage Given to Date: 16 Gy
Reference Point Session Dosage Given: 2 Gy
Session Number: 8

## 2023-04-12 NOTE — Progress Notes (Unsigned)
[facility] OFFICE PROGRESS NOTE  Patient, No Pcp Per No address on file  DIAGNOSIS: stage IIIa (T2a, N2, M0) non-small cell lung cancer, with unknown subtype secondary to insufficient tissue presented with left upper lobe lung mass in addition to left hilar and AP window lymphadenopathy diagnosed in October 2024.   PRIOR THERAPY: None  CURRENT THERAPY: Concurrent chemoradiation with carboplatin for AUC of 2 and paclitaxel 45 mg/m2 chemotherapy. Status post 2 cycles. First dose on 03/30/23. Status post 2 cycles.   INTERVAL HISTORY: Debra Lutz 62 y.o. female returns to the clinic today for a follow-up visit accompanied by her granddaughter and granddaughters boyfriend.  The patient was last seen in clinic by my colleague, Herbert Seta.  The patient is currently undergoing a course of concurrent chemoradiation.  She is status post 2 cycles of treatment and has tolerated it well without any adverse side effects except she had some issues with hypotension with the benadryl. Therefore, this was changed to cetrizine.   Her last day radiation is scheduled for 05/14/23  Today she denies any fever, chills, night sweats, or unexplained weight loss.  She reports her swallowing is okay.  She does have some diminished appetite but she is trying to make sure she eats.  The patient has cut back on smoking.  She is only smoked approximately 3 cigarettes in the last week or so.  This has improved her shortness of breath and cough.  She denies any chest pain or hemoptysis. Denies any nausea, vomiting, or diarrhea. She has some mild constipation. Denies any headache or visual changes.  Denies any rashes or skin changes except for some mild dry skin on central back and chest secondary to radiation.  She is here today for evaluation repeat blood work before undergoing cycle #3.    MEDICAL HISTORY: Past Medical History:  Diagnosis Date   COPD (chronic obstructive pulmonary disease) (HCC)    Lung nodule 09/2022    hypermetabolic left upper lobe    ALLERGIES:  is allergic to wixela inhub [fluticasone-salmeterol].  MEDICATIONS:  Current Outpatient Medications  Medication Sig Dispense Refill   albuterol (ACCUNEB) 0.63 MG/3ML nebulizer solution Take 1 ampule by nebulization every 6 (six) hours as needed for wheezing.     albuterol (VENTOLIN HFA) 108 (90 Base) MCG/ACT inhaler Inhale into the lungs every 6 (six) hours as needed for wheezing or shortness of breath. Using at night     fexofenadine (ALLEGRA ALLERGY) 180 MG tablet Take 180 mg by mouth daily.     ibuprofen (ADVIL) 200 MG tablet Take 200 mg by mouth every 4 (four) hours as needed for mild pain (pain score 1-3).     Multiple Vitamins-Minerals (MULTIVITAMINS THER. W/MINERALS) TABS tablet Take 1 tablet by mouth daily.     prochlorperazine (COMPAZINE) 10 MG tablet Take 1 tablet (10 mg total) by mouth every 6 (six) hours as needed for nausea or vomiting. 30 tablet 1   No current facility-administered medications for this visit.    SURGICAL HISTORY:  Past Surgical History:  Procedure Laterality Date   BRONCHIAL NEEDLE ASPIRATION BIOPSY  03/02/2023   Procedure: BRONCHIAL NEEDLE ASPIRATION BIOPSIES;  Surgeon: Josephine Igo, DO;  Location: [facility];  Service: Pulmonary;;   VIDEO BRONCHOSCOPY WITH ENDOBRONCHIAL ULTRASOUND Left 03/02/2023   Procedure: VIDEO BRONCHOSCOPY WITH ENDOBRONCHIAL ULTRASOUND;  Surgeon: Josephine Igo, DO;  Location: [facility];  Service: Pulmonary;  Laterality: Left;    REVIEW OF SYSTEMS:   Review of Systems  Constitutional: Negative  for appetite change, chills, fatigue, fever and unexpected weight change.  HENT: Negative for mouth sores, nosebleeds, sore throat and trouble swallowing.   Eyes: Negative for eye problems and icterus.  Respiratory: Improving dyspnea and cough. Negative for hemoptysis and wheezing.   Cardiovascular: Negative for chest pain and leg swelling.  Gastrointestinal: Positive for mild  constipation. Negative for abdominal pain,  diarrhea, nausea and vomiting.  Genitourinary: Negative for bladder incontinence, difficulty urinating, dysuria, frequency and hematuria.   Musculoskeletal: Negative for back pain, gait problem, neck pain and neck stiffness.  Skin: positive for mild radiation skin changes/rash on central chest and back. Neurological: Negative for dizziness, extremity weakness, gait problem, headaches, light-headedness and seizures.  Hematological: Negative for adenopathy. Does not bruise/bleed easily.  Psychiatric/Behavioral: Negative for confusion, depression and sleep disturbance. The patient is not nervous/anxious.     PHYSICAL EXAMINATION:  There were no vitals taken for this visit.  ECOG PERFORMANCE STATUS: 1  Physical Exam  Constitutional: Oriented to person, place, and time and well-developed, well-nourished, and in no distress.   HENT:  Head: Normocephalic and atraumatic.  Mouth/Throat: Oropharynx is clear and moist. No oropharyngeal exudate.  Eyes: Conjunctivae are normal. Right eye exhibits no discharge. Left eye exhibits no discharge. No scleral icterus.  Neck: Normal range of motion. Neck supple.  Cardiovascular: Normal rate, regular rhythm, normal heart sounds and intact distal pulses.   Pulmonary/Chest: Effort normal and breath sounds normal. No respiratory distress. No wheezes. No rales.  Abdominal: Soft. Bowel sounds are normal. Exhibits no distension and no mass. There is no tenderness.  Musculoskeletal: Normal range of motion. Exhibits no edema.  Lymphadenopathy:    No cervical adenopathy.  Neurological: Alert and oriented to person, place, and time. Exhibits normal muscle tone. Gait normal. Coordination normal.  Skin: Skin is warm and dry. Mild radiation skin changes on back and chest. Not diaphoretic. No erythema. No pallor.  Psychiatric: Mood, memory and judgment normal.  Vitals reviewed.  LABORATORY DATA: Lab Results  Component Value  Date   WBC 4.9 04/07/2023   HGB 13.8 04/07/2023   HCT 41.6 04/07/2023   MCV 90.8 04/07/2023   PLT 257 04/07/2023      Chemistry      Component Value Date/Time   NA 140 04/07/2023 0903   K 4.0 04/07/2023 0903   CL 102 04/07/2023 0903   CO2 32 04/07/2023 0903   BUN 13 04/07/2023 0903   CREATININE 0.76 04/07/2023 0903      Component Value Date/Time   CALCIUM 9.6 04/07/2023 0903   ALKPHOS 84 04/07/2023 0903   AST 9 (L) 04/07/2023 0903   ALT 13 04/07/2023 0903   BILITOT 0.5 04/07/2023 0903       RADIOGRAPHIC STUDIES:  MR Brain W Wo Contrast  Result Date: 04/07/2023 CLINICAL DATA:  Non-small cell lung cancer, staging EXAM: MRI HEAD WITHOUT AND WITH CONTRAST TECHNIQUE: Multiplanar, multiecho pulse sequences of the brain and surrounding structures were obtained without and with intravenous contrast. CONTRAST:  7mL GADAVIST GADOBUTROL 1 MMOL/ML IV SOLN COMPARISON:  None Available. FINDINGS: Brain: No restricted diffusion to suggest acute or subacute infarct. No abnormal parenchymal or meningeal enhancement. No acute hemorrhage, mass, mass effect, or midline shift. No hydrocephalus or extra-axial collection. Partial empty sella. Craniocervical junction within normal limits. No hemosiderin deposition to suggest remote hemorrhage. Normal cerebral volume for age. No evidence of remote cortical or lacunar infarct. Vascular: Normal arterial flow voids. Narrowing of the distal transverse sinuses near the transverse-sigmoid junction. Otherwise normal  arterial and venous enhancement. Skull and upper cervical spine: Normal marrow signal. Sinuses/Orbits: Mucosal thickening in the ethmoid air cells. No acute finding in the orbits. Other: The mastoid air cells are well aerated. IMPRESSION: 1. No acute intracranial process. No evidence of intracranial metastatic disease. 2. Partial empty sella and narrowing of the distal transverse sinuses near the transverse-sigmoid junction, which can be seen in the  setting of idiopathic intracranial hypertension. Electronically Signed   By: Wiliam Ke M.D.   On: 04/07/2023 11:34     ASSESSMENT/PLAN:  This is a very pleasant 62 year old Caucasian female diagnosed with stage IIIa (T2a, N2, M0) non-small cell lung cancer, with unknown subtype secondary to insufficient tissue presented with left upper lobe lung mass in addition to left hilar and AP window lymphadenopathy diagnosed in October 2024.   She is currently undergoing a course concurrent chemoradiation with carboplatin for an AUC of 2 and paclitaxel 45 mg/m.  She is status post 2 weeks of treatment.  She is tolerated well without any adverse side effects.  Labs were reviewed.  Recommend that she proceed with cycle #3 today as scheduled.  Will see her back for follow-up visit in 2 weeks for evaluation repeat blood work before undergoing cycle #5.  The patient was scheduled to see me next week on 04/22/2023.  She does not need to see me next week.  I will see her in 2 weeks.  Therefore we will move her lab appointment closer to her radiation time.  Her last day radiation is tentatively scheduled for 05/13/2022.  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   She was advised to use stool softener for her mild constipation.  If needed she can take a laxative.  She knows to avoid suppositories.  The patient was wondering if she can use an immune booster pill.  This contains vitamins as well as some herbal supplements.  Due to lack of data with herbal supplements, I recommend taking a women's once a day multivitamin instead of herbal supplements.  The patient was advised to call immediately if she has any concerning symptoms in the interval. The patient voices understanding of current disease status and treatment options and is in agreement with the current care plan. All questions were answered. The patient knows to call the clinic with any problems, questions or  concerns. We can certainly see the patient much sooner if necessary     No orders of the defined types were placed in this encounter.     The total time spent in the appointment was 20-29 minutes  Tameshia Bonneville L Jamal Pavon, PA-C 04/12/23

## 2023-04-13 ENCOUNTER — Other Ambulatory Visit: Payer: Self-pay

## 2023-04-13 ENCOUNTER — Ambulatory Visit
Admission: RE | Admit: 2023-04-13 | Discharge: 2023-04-13 | Disposition: A | Discharge status: Home / Self Care | Payer: Self-pay | Source: Ambulatory Visit | Attending: Radiation Oncology | Admitting: Radiation Oncology

## 2023-04-13 LAB — RAD ONC ARIA SESSION SUMMARY
Course Elapsed Days: 14
Plan Fractions Treated to Date: 9
Plan Prescribed Dose Per Fraction: 2 Gy
Plan Total Fractions Prescribed: 30
Plan Total Prescribed Dose: 60 Gy
Reference Point Dosage Given to Date: 18 Gy
Reference Point Session Dosage Given: 2 Gy
Session Number: 9

## 2023-04-14 ENCOUNTER — Other Ambulatory Visit: Payer: Self-pay

## 2023-04-14 ENCOUNTER — Ambulatory Visit
Admission: RE | Admit: 2023-04-14 | Discharge: 2023-04-14 | Disposition: A | Discharge status: Home / Self Care | Payer: Self-pay | Source: Ambulatory Visit | Attending: Radiation Oncology | Admitting: Radiation Oncology

## 2023-04-14 LAB — RAD ONC ARIA SESSION SUMMARY
Course Elapsed Days: 15
Plan Fractions Treated to Date: 10
Plan Prescribed Dose Per Fraction: 2 Gy
Plan Total Fractions Prescribed: 30
Plan Total Prescribed Dose: 60 Gy
Reference Point Dosage Given to Date: 20 Gy
Reference Point Session Dosage Given: 2 Gy
Session Number: 10

## 2023-04-15 ENCOUNTER — Inpatient Hospital Stay: Payer: Self-pay

## 2023-04-15 ENCOUNTER — Other Ambulatory Visit: Payer: Self-pay

## 2023-04-15 ENCOUNTER — Inpatient Hospital Stay (HOSPITAL_BASED_OUTPATIENT_CLINIC_OR_DEPARTMENT_OTHER): Payer: Self-pay | Admitting: Physician Assistant

## 2023-04-15 ENCOUNTER — Ambulatory Visit
Admission: RE | Admit: 2023-04-15 | Discharge: 2023-04-15 | Disposition: A | Discharge status: Home / Self Care | Payer: Self-pay | Source: Ambulatory Visit | Attending: Radiation Oncology | Admitting: Radiation Oncology

## 2023-04-15 VITALS — BP 112/78 | HR 73 | Temp 98.0°F

## 2023-04-15 VITALS — BP 114/64 | HR 77 | Temp 98.0°F | Resp 16 | Wt 167.7 lb

## 2023-04-15 DIAGNOSIS — C3412 Malignant neoplasm of upper lobe, left bronchus or lung: Secondary | ICD-10-CM

## 2023-04-15 DIAGNOSIS — Z5111 Encounter for antineoplastic chemotherapy: Secondary | ICD-10-CM | POA: Insufficient documentation

## 2023-04-15 LAB — CBC WITH DIFFERENTIAL (CANCER CENTER ONLY)
Abs Immature Granulocytes: 0.02 10*3/uL (ref 0.00–0.07)
Basophils Absolute: 0 10*3/uL (ref 0.0–0.1)
Basophils Relative: 1 %
Eosinophils Absolute: 0.1 10*3/uL (ref 0.0–0.5)
Eosinophils Relative: 1 %
HCT: 39.8 % (ref 36.0–46.0)
Hemoglobin: 13.4 g/dL (ref 12.0–15.0)
Immature Granulocytes: 1 %
Lymphocytes Relative: 20 %
Lymphs Abs: 0.8 10*3/uL (ref 0.7–4.0)
MCH: 30.7 pg (ref 26.0–34.0)
MCHC: 33.7 g/dL (ref 30.0–36.0)
MCV: 91.3 fL (ref 80.0–100.0)
Monocytes Absolute: 0.3 10*3/uL (ref 0.1–1.0)
Monocytes Relative: 7 %
Neutro Abs: 2.7 10*3/uL (ref 1.7–7.7)
Neutrophils Relative %: 70 %
Platelet Count: 211 10*3/uL (ref 150–400)
RBC: 4.36 MIL/uL (ref 3.87–5.11)
RDW: 12.5 % (ref 11.5–15.5)
WBC Count: 3.8 10*3/uL — ABNORMAL LOW (ref 4.0–10.5)
nRBC: 0 % (ref 0.0–0.2)

## 2023-04-15 LAB — CMP (CANCER CENTER ONLY)
ALT: 11 U/L (ref 0–44)
AST: 10 U/L — ABNORMAL LOW (ref 15–41)
Albumin: 4.3 g/dL (ref 3.5–5.0)
Alkaline Phosphatase: 85 U/L (ref 38–126)
Anion gap: 7 (ref 5–15)
BUN: 10 mg/dL (ref 8–23)
CO2: 30 mmol/L (ref 22–32)
Calcium: 9.4 mg/dL (ref 8.9–10.3)
Chloride: 102 mmol/L (ref 98–111)
Creatinine: 0.75 mg/dL (ref 0.44–1.00)
GFR, Estimated: >60 mL/min (ref >60)
Glucose, Bld: 86 mg/dL (ref 70–99)
Potassium: 3.9 mmol/L (ref 3.5–5.1)
Sodium: 139 mmol/L (ref 135–145)
Total Bilirubin: 0.5 mg/dL (ref <1.2)
Total Protein: 7.1 g/dL (ref 6.5–8.1)

## 2023-04-15 LAB — RAD ONC ARIA SESSION SUMMARY
Course Elapsed Days: 16
Plan Fractions Treated to Date: 11
Plan Prescribed Dose Per Fraction: 2 Gy
Plan Total Fractions Prescribed: 30
Plan Total Prescribed Dose: 60 Gy
Reference Point Dosage Given to Date: 22 Gy
Reference Point Session Dosage Given: 2 Gy
Session Number: 11

## 2023-04-15 MED ORDER — DEXAMETHASONE SODIUM PHOSPHATE 10 MG/ML IJ SOLN
10.0000 mg | Freq: Once | INTRAMUSCULAR | Status: AC
Start: 2023-04-15 — End: 2023-04-15
  Administered 2023-04-15: 10 mg via INTRAVENOUS
  Filled 2023-04-15: qty 1

## 2023-04-15 MED ORDER — PALONOSETRON HCL INJECTION 0.25 MG/5ML
0.2500 mg | Freq: Once | INTRAVENOUS | Status: AC
  Administered 2023-04-15: 0.25 mg via INTRAVENOUS
  Filled 2023-04-15: qty 5

## 2023-04-15 MED ORDER — FAMOTIDINE IN NACL 20-0.9 MG/50ML-% IV SOLN
20.0000 mg | Freq: Once | INTRAVENOUS | Status: AC
  Administered 2023-04-15: 20 mg via INTRAVENOUS
  Filled 2023-04-15: qty 50

## 2023-04-15 MED ORDER — CETIRIZINE HCL 10 MG/ML IV SOLN
10.0000 mg | Freq: Once | INTRAVENOUS | Status: AC
  Administered 2023-04-15: 10 mg via INTRAVENOUS
  Filled 2023-04-15: qty 1

## 2023-04-15 MED ORDER — SODIUM CHLORIDE 0.9 % IV SOLN
222.2000 mg | Freq: Once | INTRAVENOUS | Status: AC
  Administered 2023-04-15: 220 mg via INTRAVENOUS
  Filled 2023-04-15: qty 22

## 2023-04-15 MED ORDER — SODIUM CHLORIDE 0.9 % IV SOLN
45.0000 mg/m2 | Freq: Once | INTRAVENOUS | Status: AC
  Administered 2023-04-15: 84 mg via INTRAVENOUS
  Filled 2023-04-15: qty 14

## 2023-04-15 MED ORDER — SODIUM CHLORIDE 0.9 % IV SOLN
INTRAVENOUS | Status: DC
Start: 2023-04-15 — End: 2023-04-15

## 2023-04-15 NOTE — Progress Notes (Signed)
Patient has radiation appt on 12/18 @ 1015.  Labs were scheduled for 8.  Rescheduled lab appt to 0945 to be closer to radiation appt.

## 2023-04-16 ENCOUNTER — Other Ambulatory Visit: Payer: Self-pay

## 2023-04-16 ENCOUNTER — Ambulatory Visit
Admission: RE | Admit: 2023-04-16 | Discharge: 2023-04-16 | Disposition: A | Discharge status: Home / Self Care | Payer: Self-pay | Source: Ambulatory Visit | Attending: Radiation Oncology | Admitting: Radiation Oncology

## 2023-04-16 LAB — RAD ONC ARIA SESSION SUMMARY
Course Elapsed Days: 17
Plan Fractions Treated to Date: 12
Plan Prescribed Dose Per Fraction: 2 Gy
Plan Total Fractions Prescribed: 30
Plan Total Prescribed Dose: 60 Gy
Reference Point Dosage Given to Date: 24 Gy
Reference Point Session Dosage Given: 2 Gy
Session Number: 12

## 2023-04-17 ENCOUNTER — Other Ambulatory Visit: Payer: Self-pay

## 2023-04-17 ENCOUNTER — Ambulatory Visit
Admission: RE | Admit: 2023-04-17 | Discharge: 2023-04-17 | Disposition: A | Discharge status: Home / Self Care | Payer: Self-pay | Source: Ambulatory Visit | Attending: Radiation Oncology | Admitting: Radiation Oncology

## 2023-04-17 LAB — RAD ONC ARIA SESSION SUMMARY
Course Elapsed Days: 18
Plan Fractions Treated to Date: 13
Plan Prescribed Dose Per Fraction: 2 Gy
Plan Total Fractions Prescribed: 30
Plan Total Prescribed Dose: 60 Gy
Reference Point Dosage Given to Date: 26 Gy
Reference Point Session Dosage Given: 2 Gy
Session Number: 13

## 2023-04-20 ENCOUNTER — Ambulatory Visit
Admission: RE | Admit: 2023-04-20 | Discharge: 2023-04-20 | Disposition: A | Discharge status: Home / Self Care | Payer: Self-pay | Source: Ambulatory Visit | Attending: Radiation Oncology | Admitting: Radiation Oncology

## 2023-04-20 ENCOUNTER — Other Ambulatory Visit: Payer: Self-pay

## 2023-04-20 ENCOUNTER — Other Ambulatory Visit: Payer: Self-pay | Admitting: Radiation Oncology

## 2023-04-20 LAB — RAD ONC ARIA SESSION SUMMARY
Course Elapsed Days: 21
Plan Fractions Treated to Date: 14
Plan Prescribed Dose Per Fraction: 2 Gy
Plan Total Fractions Prescribed: 30
Plan Total Prescribed Dose: 60 Gy
Reference Point Dosage Given to Date: 28 Gy
Reference Point Session Dosage Given: 2 Gy
Session Number: 14

## 2023-04-20 MED ORDER — SUCRALFATE 1 G PO TABS: 1.0000 g | ORAL_TABLET | Freq: Three times a day (TID) | ORAL | 1 refills | Status: DC

## 2023-04-21 ENCOUNTER — Other Ambulatory Visit: Payer: Self-pay

## 2023-04-21 ENCOUNTER — Ambulatory Visit: Payer: Self-pay

## 2023-04-21 ENCOUNTER — Ambulatory Visit
Admission: RE | Admit: 2023-04-21 | Discharge: 2023-04-21 | Disposition: A | Discharge status: Home / Self Care | Payer: Self-pay | Source: Ambulatory Visit | Attending: Radiation Oncology | Admitting: Radiation Oncology

## 2023-04-21 LAB — RAD ONC ARIA SESSION SUMMARY
Course Elapsed Days: 22
Plan Fractions Treated to Date: 15
Plan Prescribed Dose Per Fraction: 2 Gy
Plan Total Fractions Prescribed: 30
Plan Total Prescribed Dose: 60 Gy
Reference Point Dosage Given to Date: 30 Gy
Reference Point Session Dosage Given: 2 Gy
Session Number: 15

## 2023-04-22 ENCOUNTER — Other Ambulatory Visit: Payer: Self-pay

## 2023-04-22 ENCOUNTER — Inpatient Hospital Stay: Payer: Self-pay | Admitting: Physician Assistant

## 2023-04-22 ENCOUNTER — Inpatient Hospital Stay: Payer: Self-pay

## 2023-04-22 ENCOUNTER — Ambulatory Visit
Admission: RE | Admit: 2023-04-22 | Discharge: 2023-04-22 | Disposition: A | Discharge status: Home / Self Care | Payer: Self-pay | Source: Ambulatory Visit | Attending: Radiation Oncology | Admitting: Radiation Oncology

## 2023-04-22 VITALS — BP 87/71 | HR 65 | Temp 98.5°F | Wt 167.8 lb

## 2023-04-22 DIAGNOSIS — C3412 Malignant neoplasm of upper lobe, left bronchus or lung: Secondary | ICD-10-CM

## 2023-04-22 LAB — CMP (CANCER CENTER ONLY)
ALT: 13 U/L (ref 0–44)
AST: 12 U/L — ABNORMAL LOW (ref 15–41)
Albumin: 4.4 g/dL (ref 3.5–5.0)
Alkaline Phosphatase: 88 U/L (ref 38–126)
Anion gap: 5 (ref 5–15)
BUN: 9 mg/dL (ref 8–23)
CO2: 30 mmol/L (ref 22–32)
Calcium: 9.6 mg/dL (ref 8.9–10.3)
Chloride: 104 mmol/L (ref 98–111)
Creatinine: 0.75 mg/dL (ref 0.44–1.00)
GFR, Estimated: >60 mL/min (ref >60)
Glucose, Bld: 91 mg/dL (ref 70–99)
Potassium: 4.1 mmol/L (ref 3.5–5.1)
Sodium: 139 mmol/L (ref 135–145)
Total Bilirubin: 0.5 mg/dL (ref <1.2)
Total Protein: 7 g/dL (ref 6.5–8.1)

## 2023-04-22 LAB — CBC WITH DIFFERENTIAL (CANCER CENTER ONLY)
Abs Immature Granulocytes: 0.02 10*3/uL (ref 0.00–0.07)
Basophils Absolute: 0 10*3/uL (ref 0.0–0.1)
Basophils Relative: 1 %
Eosinophils Absolute: 0 10*3/uL (ref 0.0–0.5)
Eosinophils Relative: 1 %
HCT: 38.8 % (ref 36.0–46.0)
Hemoglobin: 13.1 g/dL (ref 12.0–15.0)
Immature Granulocytes: 1 %
Lymphocytes Relative: 16 %
Lymphs Abs: 0.5 10*3/uL — ABNORMAL LOW (ref 0.7–4.0)
MCH: 30.7 pg (ref 26.0–34.0)
MCHC: 33.8 g/dL (ref 30.0–36.0)
MCV: 90.9 fL (ref 80.0–100.0)
Monocytes Absolute: 0.2 10*3/uL (ref 0.1–1.0)
Monocytes Relative: 7 %
Neutro Abs: 2.5 10*3/uL (ref 1.7–7.7)
Neutrophils Relative %: 74 %
Platelet Count: 179 10*3/uL (ref 150–400)
RBC: 4.27 MIL/uL (ref 3.87–5.11)
RDW: 13 % (ref 11.5–15.5)
WBC Count: 3.4 10*3/uL — ABNORMAL LOW (ref 4.0–10.5)
nRBC: 0 % (ref 0.0–0.2)

## 2023-04-22 LAB — RAD ONC ARIA SESSION SUMMARY
Course Elapsed Days: 23
Plan Fractions Treated to Date: 16
Plan Prescribed Dose Per Fraction: 2 Gy
Plan Total Fractions Prescribed: 30
Plan Total Prescribed Dose: 60 Gy
Reference Point Dosage Given to Date: 32 Gy
Reference Point Session Dosage Given: 2 Gy
Session Number: 16

## 2023-04-22 MED ORDER — DEXAMETHASONE SODIUM PHOSPHATE 10 MG/ML IJ SOLN
10.0000 mg | Freq: Once | INTRAMUSCULAR | Status: AC
Start: 2023-04-22 — End: 2023-04-22
  Administered 2023-04-22: 10 mg via INTRAVENOUS
  Filled 2023-04-22: qty 1

## 2023-04-22 MED ORDER — PALONOSETRON HCL INJECTION 0.25 MG/5ML
0.2500 mg | Freq: Once | INTRAVENOUS | Status: AC
  Administered 2023-04-22: 0.25 mg via INTRAVENOUS
  Filled 2023-04-22: qty 5

## 2023-04-22 MED ORDER — CETIRIZINE HCL 10 MG/ML IV SOLN
10.0000 mg | Freq: Once | INTRAVENOUS | Status: AC
  Administered 2023-04-22: 10 mg via INTRAVENOUS
  Filled 2023-04-22: qty 1

## 2023-04-22 MED ORDER — PACLITAXEL CHEMO INJECTION 300 MG/50ML
45.0000 mg/m2 | Freq: Once | INTRAVENOUS | Status: AC
  Administered 2023-04-22: 84 mg via INTRAVENOUS
  Filled 2023-04-22: qty 14

## 2023-04-22 MED ORDER — SODIUM CHLORIDE 0.9 % IV SOLN
222.2000 mg | Freq: Once | INTRAVENOUS | Status: AC
  Administered 2023-04-22: 220 mg via INTRAVENOUS
  Filled 2023-04-22: qty 22

## 2023-04-22 MED ORDER — SODIUM CHLORIDE 0.9 % IV SOLN: INTRAVENOUS | Status: DC

## 2023-04-22 MED ORDER — FAMOTIDINE IN NACL 20-0.9 MG/50ML-% IV SOLN
20.0000 mg | Freq: Once | INTRAVENOUS | Status: AC
  Administered 2023-04-22: 20 mg via INTRAVENOUS
  Filled 2023-04-22: qty 50

## 2023-04-23 ENCOUNTER — Other Ambulatory Visit: Payer: Self-pay

## 2023-04-23 ENCOUNTER — Ambulatory Visit
Admission: RE | Admit: 2023-04-23 | Discharge: 2023-04-23 | Disposition: A | Discharge status: Home / Self Care | Payer: Self-pay | Source: Ambulatory Visit | Attending: Radiation Oncology | Admitting: Radiation Oncology

## 2023-04-23 LAB — RAD ONC ARIA SESSION SUMMARY
Course Elapsed Days: 24
Plan Fractions Treated to Date: 17
Plan Prescribed Dose Per Fraction: 2 Gy
Plan Total Fractions Prescribed: 30
Plan Total Prescribed Dose: 60 Gy
Reference Point Dosage Given to Date: 34 Gy
Reference Point Session Dosage Given: 2 Gy
Session Number: 17

## 2023-04-23 NOTE — Progress Notes (Signed)
[facility] OFFICE PROGRESS NOTE  Patient, No Pcp Per No address on file  DIAGNOSIS: stage IIIa (T2a, N2, M0) non-small cell lung cancer, with unknown subtype secondary to insufficient tissue presented with left upper lobe lung mass in addition to left hilar and AP window lymphadenopathy diagnosed in October 2024.   PRIOR THERAPY: None   CURRENT THERAPY: Concurrent chemoradiation with carboplatin for AUC of 2 and paclitaxel 45 mg/m2 chemotherapy. Status post 2 cycles. First dose on 03/30/23. Status post 4 cycles.   INTERVAL HISTORY: Debra Lutz 62 y.o. female returns to the clinic today for a follow-up visit. The patient is currently undergoing a course of concurrent chemoradiation. She is status post 4 cycles of treatment and has tolerated it well without any adverse side effects.  Her last day radiation is scheduled for 05/14/23.    Today, she denies any fever, chills, night sweats, or unexplained weight loss.  She reports her swallowing is "okay". Her appetite is fair and her weight is stable. Since starting treatment, she has improved her shortness of breath and cough. She states her breathing is great. She did mention she sometimes has mild blood tinged nasal drainage if she blows her nose too hard.   She denies any chest pain or hemoptysis. Denies any nausea, vomiting, or diarrhea. Her constipation has improved.  Denies any headache or visual changes. She is here today for evaluation repeat blood work before undergoing cycle #5      MEDICAL HISTORY: Past Medical History:  Diagnosis Date   COPD (chronic obstructive pulmonary disease) (HCC)    Lung nodule 09/2022   hypermetabolic left upper lobe    ALLERGIES:  is allergic to wixela inhub [fluticasone-salmeterol].  MEDICATIONS:  Current Outpatient Medications  Medication Sig Dispense Refill   albuterol (ACCUNEB) 0.63 MG/3ML nebulizer solution Take 1 ampule by nebulization every 6 (six) hours as needed for wheezing.      albuterol (VENTOLIN HFA) 108 (90 Base) MCG/ACT inhaler Inhale into the lungs every 6 (six) hours as needed for wheezing or shortness of breath. Using at night     fexofenadine (ALLEGRA ALLERGY) 180 MG tablet Take 180 mg by mouth as needed.     ibuprofen (ADVIL) 200 MG tablet Take 200 mg by mouth every 4 (four) hours as needed for mild pain (pain score 1-3).     Multiple Vitamins-Minerals (MULTIVITAMINS THER. W/MINERALS) TABS tablet Take 1 tablet by mouth daily.     prochlorperazine (COMPAZINE) 10 MG tablet Take 1 tablet (10 mg total) by mouth every 6 (six) hours as needed for nausea or vomiting. 30 tablet 1   sucralfate (CARAFATE) 1 g tablet Take 1 tablet (1 g total) by mouth 4 (four) times daily -  with meals and at bedtime. Crush and dissolve in 10 mL of warm water prior to swallowing.  Take 20 to 30 minutes before meals 120 tablet 1   No current facility-administered medications for this visit.    SURGICAL HISTORY:  Past Surgical History:  Procedure Laterality Date   BRONCHIAL NEEDLE ASPIRATION BIOPSY  03/02/2023   Procedure: BRONCHIAL NEEDLE ASPIRATION BIOPSIES;  Surgeon: Josephine Igo, DO;  Location: [facility];  Service: Pulmonary;;   VIDEO BRONCHOSCOPY WITH ENDOBRONCHIAL ULTRASOUND Left 03/02/2023   Procedure: VIDEO BRONCHOSCOPY WITH ENDOBRONCHIAL ULTRASOUND;  Surgeon: Josephine Igo, DO;  Location: [facility];  Service: Pulmonary;  Laterality: Left;    REVIEW OF SYSTEMS:   Review of Systems  Constitutional: Negative for appetite change, chills, fatigue, fever and  unexpected weight change.  HENT: Negative for mouth sores, nosebleeds, sore throat and trouble swallowing.   Eyes: Negative for eye problems and icterus.  Respiratory: Improving dyspnea and cough. Negative for hemoptysis and wheezing.   Cardiovascular: Negative for chest pain and leg swelling.  Gastrointestinal: Negative for abdominal pain, constipation, diarrhea, nausea and vomiting.  Genitourinary: Negative  for bladder incontinence, difficulty urinating, dysuria, frequency and hematuria.   Musculoskeletal: Negative for back pain, gait problem, neck pain and neck stiffness.  Skin: positive for mild radiation skin changes/rash on central chest and back. Neurological: Negative for dizziness, extremity weakness, gait problem, headaches, light-headedness and seizures.  Hematological: Negative for adenopathy. Does not bruise/bleed easily.  Psychiatric/Behavioral: Negative for confusion, depression and sleep disturbance. The patient is not nervous/anxious.     PHYSICAL EXAMINATION:  Blood pressure 124/76, pulse 72, temperature 98.1 F (36.7 C), temperature source Temporal, resp. rate 16, weight 167 lb 14.4 oz (76.2 kg), SpO2 98%.  ECOG PERFORMANCE STATUS: 1  Physical Exam  Constitutional: Oriented to person, place, and time and well-developed, well-nourished, and in no distress.   HENT:  Head: Normocephalic and atraumatic.  Mouth/Throat: Oropharynx is clear and moist. No oropharyngeal exudate.  Eyes: Conjunctivae are normal. Right eye exhibits no discharge. Left eye exhibits no discharge. No scleral icterus.  Neck: Normal range of motion. Neck supple.  Cardiovascular: Normal rate, regular rhythm, normal heart sounds and intact distal pulses.   Pulmonary/Chest: Effort normal and breath sounds normal. No respiratory distress. No wheezes. No rales.  Abdominal: Soft. Bowel sounds are normal. Exhibits no distension and no mass. There is no tenderness.  Musculoskeletal: Normal range of motion. Exhibits no edema.  Lymphadenopathy:    No cervical adenopathy.  Neurological: Alert and oriented to person, place, and time. Exhibits normal muscle tone. Gait normal. Coordination normal.  Skin: Skin is warm and dry. Mild radiation skin changes on back and chest. Not diaphoretic. No erythema. No pallor.  Psychiatric: Mood, memory and judgment normal.  Vitals reviewed.  LABORATORY DATA: Lab Results  Component  Value Date   WBC 2.3 (L) 04/27/2023   HGB 12.7 04/27/2023   HCT 37.2 04/27/2023   MCV 89.0 04/27/2023   PLT 133 (L) 04/27/2023      Chemistry      Component Value Date/Time   NA 138 04/27/2023 0859   K 3.7 04/27/2023 0859   CL 103 04/27/2023 0859   CO2 29 04/27/2023 0859   BUN 10 04/27/2023 0859   CREATININE 0.73 04/27/2023 0859      Component Value Date/Time   CALCIUM 9.2 04/27/2023 0859   ALKPHOS 77 04/27/2023 0859   AST 13 (L) 04/27/2023 0859   ALT 14 04/27/2023 0859   BILITOT 0.6 04/27/2023 0859       RADIOGRAPHIC STUDIES:  No results found.    ASSESSMENT/PLAN:  This is a very pleasant 62 year old Caucasian female diagnosed with stage IIIa (T2a, N2, M0) non-small cell lung cancer, with unknown subtype secondary to insufficient tissue presented with left upper lobe lung mass in addition to left hilar and AP window lymphadenopathy diagnosed in October 2024.    She is currently undergoing a course concurrent chemoradiation with carboplatin for an AUC of 2 and paclitaxel 45 mg/m.  She is status post 4 weeks of treatment.  She is tolerated well without any adverse side effects.  Her last treatment was on 12/18. Today is a little too early for treatment. We will reschedule this for tomorrow on 04/28/23.    Labs were  reviewed.  Recommend that she proceed with cycle #5 tomorrow as scheduled.  We reviewed following precautions due to risk of infection with chemotherapy such as masking, washing hands, washing fruits and vegetables, etc.  She is advised to always call if she has she ever develops any signs and symptoms of infection while undergoing treatment.  She does not have any signs of infection at this time.  Will see her back for follow-up visit in 2 weeks for evaluation repeat blood work before undergoing cycle #7.     Her last day radiation is tentatively scheduled for 05/13/2022.   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     The patient was advised to call immediately if she has any concerning symptoms in the interval. The patient voices understanding of current disease status and treatment options and is in agreement with the current care plan. All questions were answered. The patient knows to call the clinic with any problems, questions or concerns. We can certainly see the patient much sooner if necessary   No orders of the defined types were placed in this encounter.   The total time spent in the appointment was 20-29 minutes  Rasul Decola L Viktoria Gruetzmacher, PA-C 04/27/23

## 2023-04-24 ENCOUNTER — Encounter: Payer: Self-pay | Admitting: Internal Medicine

## 2023-04-24 ENCOUNTER — Other Ambulatory Visit: Payer: Self-pay

## 2023-04-24 ENCOUNTER — Ambulatory Visit
Admission: RE | Admit: 2023-04-24 | Discharge: 2023-04-24 | Disposition: A | Discharge status: Home / Self Care | Payer: Self-pay | Source: Ambulatory Visit | Attending: Radiation Oncology | Admitting: Radiation Oncology

## 2023-04-24 LAB — RAD ONC ARIA SESSION SUMMARY
Course Elapsed Days: 25
Plan Fractions Treated to Date: 18
Plan Prescribed Dose Per Fraction: 2 Gy
Plan Total Fractions Prescribed: 30
Plan Total Prescribed Dose: 60 Gy
Reference Point Dosage Given to Date: 36 Gy
Reference Point Session Dosage Given: 2 Gy
Session Number: 18

## 2023-04-27 ENCOUNTER — Inpatient Hospital Stay (HOSPITAL_BASED_OUTPATIENT_CLINIC_OR_DEPARTMENT_OTHER): Payer: Self-pay | Admitting: Physician Assistant

## 2023-04-27 ENCOUNTER — Ambulatory Visit
Admission: RE | Admit: 2023-04-27 | Discharge: 2023-04-27 | Disposition: A | Discharge status: Home / Self Care | Payer: Self-pay | Source: Ambulatory Visit | Attending: Radiation Oncology | Admitting: Radiation Oncology

## 2023-04-27 ENCOUNTER — Other Ambulatory Visit: Payer: Self-pay

## 2023-04-27 ENCOUNTER — Inpatient Hospital Stay: Payer: Self-pay

## 2023-04-27 VITALS — BP 124/76 | HR 72 | Temp 98.1°F | Resp 16 | Wt 167.9 lb

## 2023-04-27 DIAGNOSIS — C3412 Malignant neoplasm of upper lobe, left bronchus or lung: Secondary | ICD-10-CM

## 2023-04-27 DIAGNOSIS — Z5111 Encounter for antineoplastic chemotherapy: Secondary | ICD-10-CM

## 2023-04-27 LAB — CBC WITH DIFFERENTIAL (CANCER CENTER ONLY)
Abs Immature Granulocytes: 0.02 10*3/uL (ref 0.00–0.07)
Basophils Absolute: 0 10*3/uL (ref 0.0–0.1)
Basophils Relative: 1 %
Eosinophils Absolute: 0 10*3/uL (ref 0.0–0.5)
Eosinophils Relative: 1 %
HCT: 37.2 % (ref 36.0–46.0)
Hemoglobin: 12.7 g/dL (ref 12.0–15.0)
Immature Granulocytes: 1 %
Lymphocytes Relative: 19 %
Lymphs Abs: 0.4 10*3/uL — ABNORMAL LOW (ref 0.7–4.0)
MCH: 30.4 pg (ref 26.0–34.0)
MCHC: 34.1 g/dL (ref 30.0–36.0)
MCV: 89 fL (ref 80.0–100.0)
Monocytes Absolute: 0.1 10*3/uL (ref 0.1–1.0)
Monocytes Relative: 6 %
Neutro Abs: 1.6 10*3/uL — ABNORMAL LOW (ref 1.7–7.7)
Neutrophils Relative %: 72 %
Platelet Count: 133 10*3/uL — ABNORMAL LOW (ref 150–400)
RBC: 4.18 MIL/uL (ref 3.87–5.11)
RDW: 13.2 % (ref 11.5–15.5)
WBC Count: 2.3 10*3/uL — ABNORMAL LOW (ref 4.0–10.5)
nRBC: 0 % (ref 0.0–0.2)

## 2023-04-27 LAB — CMP (CANCER CENTER ONLY)
ALT: 14 U/L (ref 0–44)
AST: 13 U/L — ABNORMAL LOW (ref 15–41)
Albumin: 4.3 g/dL (ref 3.5–5.0)
Alkaline Phosphatase: 77 U/L (ref 38–126)
Anion gap: 6 (ref 5–15)
BUN: 10 mg/dL (ref 8–23)
CO2: 29 mmol/L (ref 22–32)
Calcium: 9.2 mg/dL (ref 8.9–10.3)
Chloride: 103 mmol/L (ref 98–111)
Creatinine: 0.73 mg/dL (ref 0.44–1.00)
GFR, Estimated: >60 mL/min (ref >60)
Glucose, Bld: 95 mg/dL (ref 70–99)
Potassium: 3.7 mmol/L (ref 3.5–5.1)
Sodium: 138 mmol/L (ref 135–145)
Total Bilirubin: 0.6 mg/dL (ref <1.2)
Total Protein: 6.6 g/dL (ref 6.5–8.1)

## 2023-04-27 LAB — RAD ONC ARIA SESSION SUMMARY
Course Elapsed Days: 28
Plan Fractions Treated to Date: 19
Plan Prescribed Dose Per Fraction: 2 Gy
Plan Total Fractions Prescribed: 30
Plan Total Prescribed Dose: 60 Gy
Reference Point Dosage Given to Date: 38 Gy
Reference Point Session Dosage Given: 2 Gy
Session Number: 19

## 2023-04-28 ENCOUNTER — Other Ambulatory Visit: Payer: Self-pay

## 2023-04-28 ENCOUNTER — Ambulatory Visit
Admission: RE | Admit: 2023-04-28 | Discharge: 2023-04-28 | Disposition: A | Discharge status: Home / Self Care | Payer: Self-pay | Source: Ambulatory Visit | Attending: Radiation Oncology | Admitting: Radiation Oncology

## 2023-04-28 ENCOUNTER — Inpatient Hospital Stay: Payer: Self-pay

## 2023-04-28 VITALS — BP 117/73 | HR 75 | Temp 97.8°F | Resp 18 | Wt 167.4 lb

## 2023-04-28 DIAGNOSIS — C3412 Malignant neoplasm of upper lobe, left bronchus or lung: Secondary | ICD-10-CM

## 2023-04-28 LAB — RAD ONC ARIA SESSION SUMMARY
Course Elapsed Days: 29
Plan Fractions Treated to Date: 20
Plan Prescribed Dose Per Fraction: 2 Gy
Plan Total Fractions Prescribed: 30
Plan Total Prescribed Dose: 60 Gy
Reference Point Dosage Given to Date: 40 Gy
Reference Point Session Dosage Given: 2 Gy
Session Number: 20

## 2023-04-28 MED ORDER — CARBOPLATIN CHEMO INJECTION 450 MG/45ML
222.2000 mg | Freq: Once | INTRAVENOUS | Status: AC
  Administered 2023-04-28: 220 mg via INTRAVENOUS
  Filled 2023-04-28: qty 22

## 2023-04-28 MED ORDER — SODIUM CHLORIDE 0.9 % IV SOLN
INTRAVENOUS | Status: DC
Start: 2023-04-28 — End: 2023-04-28

## 2023-04-28 MED ORDER — DEXAMETHASONE SODIUM PHOSPHATE 10 MG/ML IJ SOLN
10.0000 mg | Freq: Once | INTRAMUSCULAR | Status: AC
  Administered 2023-04-28: 10 mg via INTRAVENOUS
  Filled 2023-04-28: qty 1

## 2023-04-28 MED ORDER — PALONOSETRON HCL INJECTION 0.25 MG/5ML
0.2500 mg | Freq: Once | INTRAVENOUS | Status: AC
Start: 2023-04-28 — End: 2023-04-28
  Administered 2023-04-28: 0.25 mg via INTRAVENOUS
  Filled 2023-04-28: qty 5

## 2023-04-28 MED ORDER — SODIUM CHLORIDE 0.9 % IV SOLN
45.0000 mg/m2 | Freq: Once | INTRAVENOUS | Status: AC
  Administered 2023-04-28: 84 mg via INTRAVENOUS
  Filled 2023-04-28: qty 14

## 2023-04-28 MED ORDER — CETIRIZINE HCL 10 MG/ML IV SOLN
10.0000 mg | Freq: Once | INTRAVENOUS | Status: AC
  Administered 2023-04-28: 10 mg via INTRAVENOUS
  Filled 2023-04-28: qty 1

## 2023-04-28 MED ORDER — FAMOTIDINE IN NACL 20-0.9 MG/50ML-% IV SOLN
20.0000 mg | Freq: Once | INTRAVENOUS | Status: AC
Start: 2023-04-28 — End: 2023-04-28
  Administered 2023-04-28: 20 mg via INTRAVENOUS
  Filled 2023-04-28: qty 50

## 2023-04-28 NOTE — Patient Instructions (Signed)
 [facility] - A DEPT OF [facility]  Discharge Instructions: Thank you for choosing [facility] to provide your oncology and hematology care.   If you have a lab appointment with the Cancer Center, please go directly to the Cancer Center and check in at the registration area.   Wear comfortable clothing and clothing appropriate for easy access to any Portacath or PICC line.   We strive to give you quality time with your provider. You may need to reschedule your appointment if you arrive late (15 or more minutes).  Arriving late affects you and other patients whose appointments are after yours.  Also, if you miss three or more appointments without notifying the office, you may be dismissed from the clinic at the provider's discretion.      For prescription refill requests, have your pharmacy contact our office and allow 72 hours for refills to be completed.    Today you received the following chemotherapy and/or immunotherapy agents: Paclitaxel (Taxol) and Carboplatin       To help prevent nausea and vomiting after your treatment, we encourage you to take your nausea medication as directed.  BELOW ARE SYMPTOMS THAT SHOULD BE REPORTED IMMEDIATELY: *FEVER GREATER THAN 100.4 F (38 C) OR HIGHER *CHILLS OR SWEATING *NAUSEA AND VOMITING THAT IS NOT CONTROLLED WITH YOUR NAUSEA MEDICATION *UNUSUAL SHORTNESS OF BREATH *UNUSUAL BRUISING OR BLEEDING *URINARY PROBLEMS (pain or burning when urinating, or frequent urination) *BOWEL PROBLEMS (unusual diarrhea, constipation, pain near the anus) TENDERNESS IN MOUTH AND THROAT WITH OR WITHOUT PRESENCE OF ULCERS (sore throat, sores in mouth, or a toothache) UNUSUAL RASH, SWELLING OR PAIN  UNUSUAL VAGINAL DISCHARGE OR ITCHING   Items with * indicate a potential emergency and should be followed up as soon as possible or go to the Emergency Department if any problems should occur.  Please show the CHEMOTHERAPY  ALERT CARD or IMMUNOTHERAPY ALERT CARD at check-in to the Emergency Department and triage nurse.  Should you have questions after your visit or need to cancel or reschedule your appointment, please contact [facility] - A DEPT [facility]  Dept: [phone]  and follow the prompts.  Office hours are 8:00 a.m. to 4:30 p.m. Monday - Friday. Please note that voicemails left after 4:00 p.m. may not be returned until the following business day.  We are closed weekends and major holidays. You have access to a nurse at all times for urgent questions. Please call the main number to the clinic Dept: [phone] and follow the prompts.   For any non-urgent questions, you may also contact your provider using MyChart. We now offer e-Visits for anyone 71 and older to request care online for non-urgent symptoms. For details visit [email or web address].   Also download the MyChart app! Go to the app store, search "MyChart", open the app, select [place], and log in with your MyChart username and password.

## 2023-04-30 ENCOUNTER — Ambulatory Visit
Admission: RE | Admit: 2023-04-30 | Discharge: 2023-04-30 | Disposition: A | Discharge status: Home / Self Care | Payer: Self-pay | Source: Ambulatory Visit | Attending: Radiation Oncology | Admitting: Radiation Oncology

## 2023-04-30 ENCOUNTER — Other Ambulatory Visit: Payer: Self-pay

## 2023-04-30 LAB — RAD ONC ARIA SESSION SUMMARY
Course Elapsed Days: 31
Plan Fractions Treated to Date: 21
Plan Prescribed Dose Per Fraction: 2 Gy
Plan Total Fractions Prescribed: 30
Plan Total Prescribed Dose: 60 Gy
Reference Point Dosage Given to Date: 42 Gy
Reference Point Session Dosage Given: 2 Gy
Session Number: 21

## 2023-05-01 ENCOUNTER — Other Ambulatory Visit: Payer: Self-pay

## 2023-05-01 ENCOUNTER — Ambulatory Visit
Admission: RE | Admit: 2023-05-01 | Discharge: 2023-05-01 | Disposition: A | Discharge status: Home / Self Care | Payer: Self-pay | Source: Ambulatory Visit | Attending: Radiation Oncology | Admitting: Radiation Oncology

## 2023-05-01 LAB — RAD ONC ARIA SESSION SUMMARY
Course Elapsed Days: 32
Plan Fractions Treated to Date: 22
Plan Prescribed Dose Per Fraction: 2 Gy
Plan Total Fractions Prescribed: 30
Plan Total Prescribed Dose: 60 Gy
Reference Point Dosage Given to Date: 44 Gy
Reference Point Session Dosage Given: 2 Gy
Session Number: 22

## 2023-05-04 ENCOUNTER — Ambulatory Visit
Admission: RE | Admit: 2023-05-04 | Discharge: 2023-05-04 | Disposition: A | Discharge status: Home / Self Care | Payer: Self-pay | Source: Ambulatory Visit | Attending: Radiation Oncology | Admitting: Radiation Oncology

## 2023-05-04 ENCOUNTER — Other Ambulatory Visit: Payer: Self-pay

## 2023-05-04 LAB — RAD ONC ARIA SESSION SUMMARY
Course Elapsed Days: 35
Plan Fractions Treated to Date: 23
Plan Prescribed Dose Per Fraction: 2 Gy
Plan Total Fractions Prescribed: 30
Plan Total Prescribed Dose: 60 Gy
Reference Point Dosage Given to Date: 46 Gy
Reference Point Session Dosage Given: 2 Gy
Session Number: 23

## 2023-05-05 ENCOUNTER — Ambulatory Visit
Admission: RE | Admit: 2023-05-05 | Discharge: 2023-05-05 | Disposition: A | Discharge status: Home / Self Care | Payer: Self-pay | Source: Ambulatory Visit | Attending: Radiation Oncology | Admitting: Radiation Oncology

## 2023-05-05 ENCOUNTER — Inpatient Hospital Stay: Payer: Self-pay

## 2023-05-05 ENCOUNTER — Other Ambulatory Visit: Payer: Self-pay

## 2023-05-05 VITALS — BP 123/83 | HR 79 | Temp 98.4°F | Resp 16 | Wt 167.1 lb

## 2023-05-05 DIAGNOSIS — C3412 Malignant neoplasm of upper lobe, left bronchus or lung: Secondary | ICD-10-CM

## 2023-05-05 LAB — CBC WITH DIFFERENTIAL (CANCER CENTER ONLY)
Abs Immature Granulocytes: 0.01 10*3/uL (ref 0.00–0.07)
Basophils Absolute: 0 10*3/uL (ref 0.0–0.1)
Basophils Relative: 1 %
Eosinophils Absolute: 0 10*3/uL (ref 0.0–0.5)
Eosinophils Relative: 1 %
HCT: 33.4 % — ABNORMAL LOW (ref 36.0–46.0)
Hemoglobin: 11.5 g/dL — ABNORMAL LOW (ref 12.0–15.0)
Immature Granulocytes: 0 %
Lymphocytes Relative: 18 %
Lymphs Abs: 0.4 10*3/uL — ABNORMAL LOW (ref 0.7–4.0)
MCH: 30.4 pg (ref 26.0–34.0)
MCHC: 34.4 g/dL (ref 30.0–36.0)
MCV: 88.4 fL (ref 80.0–100.0)
Monocytes Absolute: 0.1 10*3/uL (ref 0.1–1.0)
Monocytes Relative: 6 %
Neutro Abs: 1.6 10*3/uL — ABNORMAL LOW (ref 1.7–7.7)
Neutrophils Relative %: 74 %
Platelet Count: 105 10*3/uL — ABNORMAL LOW (ref 150–400)
RBC: 3.78 MIL/uL — ABNORMAL LOW (ref 3.87–5.11)
RDW: 13.9 % (ref 11.5–15.5)
WBC Count: 2.2 10*3/uL — ABNORMAL LOW (ref 4.0–10.5)
nRBC: 0 % (ref 0.0–0.2)

## 2023-05-05 LAB — RAD ONC ARIA SESSION SUMMARY
Course Elapsed Days: 36
Plan Fractions Treated to Date: 24
Plan Prescribed Dose Per Fraction: 2 Gy
Plan Total Fractions Prescribed: 30
Plan Total Prescribed Dose: 60 Gy
Reference Point Dosage Given to Date: 48 Gy
Reference Point Session Dosage Given: 2 Gy
Session Number: 24

## 2023-05-05 LAB — CMP (CANCER CENTER ONLY)
ALT: 19 U/L (ref 0–44)
AST: 13 U/L — ABNORMAL LOW (ref 15–41)
Albumin: 4.1 g/dL (ref 3.5–5.0)
Alkaline Phosphatase: 77 U/L (ref 38–126)
Anion gap: 7 (ref 5–15)
BUN: 13 mg/dL (ref 8–23)
CO2: 28 mmol/L (ref 22–32)
Calcium: 9.4 mg/dL (ref 8.9–10.3)
Chloride: 103 mmol/L (ref 98–111)
Creatinine: 0.71 mg/dL (ref 0.44–1.00)
GFR, Estimated: >60 mL/min (ref >60)
Glucose, Bld: 99 mg/dL (ref 70–99)
Potassium: 3.5 mmol/L (ref 3.5–5.1)
Sodium: 138 mmol/L (ref 135–145)
Total Bilirubin: 0.5 mg/dL (ref 0.0–1.2)
Total Protein: 6.8 g/dL (ref 6.5–8.1)

## 2023-05-05 MED ORDER — SODIUM CHLORIDE 0.9 % IV SOLN: INTRAVENOUS | Status: DC

## 2023-05-05 MED ORDER — FAMOTIDINE IN NACL 20-0.9 MG/50ML-% IV SOLN
20.0000 mg | Freq: Once | INTRAVENOUS | Status: AC
  Administered 2023-05-05: 20 mg via INTRAVENOUS
  Filled 2023-05-05: qty 50

## 2023-05-05 MED ORDER — SODIUM CHLORIDE 0.9 % IV SOLN
222.2000 mg | Freq: Once | INTRAVENOUS | Status: AC
  Administered 2023-05-05: 220 mg via INTRAVENOUS
  Filled 2023-05-05: qty 22

## 2023-05-05 MED ORDER — SODIUM CHLORIDE 0.9 % IV SOLN
45.0000 mg/m2 | Freq: Once | INTRAVENOUS | Status: AC
  Administered 2023-05-05: 84 mg via INTRAVENOUS
  Filled 2023-05-05: qty 14

## 2023-05-05 MED ORDER — PALONOSETRON HCL INJECTION 0.25 MG/5ML
0.2500 mg | Freq: Once | INTRAVENOUS | Status: AC
  Administered 2023-05-05: 0.25 mg via INTRAVENOUS
  Filled 2023-05-05: qty 5

## 2023-05-05 MED ORDER — CETIRIZINE HCL 10 MG/ML IV SOLN
10.0000 mg | Freq: Once | INTRAVENOUS | Status: AC
  Administered 2023-05-05: 10 mg via INTRAVENOUS
  Filled 2023-05-05: qty 1

## 2023-05-05 MED ORDER — DEXAMETHASONE SODIUM PHOSPHATE 10 MG/ML IJ SOLN
10.0000 mg | Freq: Once | INTRAMUSCULAR | Status: AC
  Administered 2023-05-05: 10 mg via INTRAVENOUS
  Filled 2023-05-05: qty 1

## 2023-05-05 NOTE — Patient Instructions (Signed)
 [facility] - A DEPT OF [facility]  Discharge Instructions: Thank you for choosing [facility] to provide your oncology and hematology care.   If you have a lab appointment with the Cancer Center, please go directly to the Cancer Center and check in at the registration area.   Wear comfortable clothing and clothing appropriate for easy access to any Portacath or PICC line.   We strive to give you quality time with your provider. You may need to reschedule your appointment if you arrive late (15 or more minutes).  Arriving late affects you and other patients whose appointments are after yours.  Also, if you miss three or more appointments without notifying the office, you may be dismissed from the clinic at the provider's discretion.      For prescription refill requests, have your pharmacy contact our office and allow 72 hours for refills to be completed.    Today you received the following chemotherapy and/or immunotherapy agents: Paclitaxel  (Taxol ) and Carboplatin    To help prevent nausea and vomiting after your treatment, we encourage you to take your nausea medication as directed.  BELOW ARE SYMPTOMS THAT SHOULD BE REPORTED IMMEDIATELY: *FEVER GREATER THAN 100.4 F (38 C) OR HIGHER *CHILLS OR SWEATING *NAUSEA AND VOMITING THAT IS NOT CONTROLLED WITH YOUR NAUSEA MEDICATION *UNUSUAL SHORTNESS OF BREATH *UNUSUAL BRUISING OR BLEEDING *URINARY PROBLEMS (pain or burning when urinating, or frequent urination) *BOWEL PROBLEMS (unusual diarrhea, constipation, pain near the anus) TENDERNESS IN MOUTH AND THROAT WITH OR WITHOUT PRESENCE OF ULCERS (sore throat, sores in mouth, or a toothache) UNUSUAL RASH, SWELLING OR PAIN  UNUSUAL VAGINAL DISCHARGE OR ITCHING   Items with * indicate a potential emergency and should be followed up as soon as possible or go to the Emergency Department if any problems should occur.  Please show the CHEMOTHERAPY ALERT  CARD or IMMUNOTHERAPY ALERT CARD at check-in to the Emergency Department and triage nurse.  Should you have questions after your visit or need to cancel or reschedule your appointment, please contact [facility] - A DEPT OF [facility]  Dept: [phone]  and follow the prompts.  Office hours are 8:00 a.m. to 4:30 p.m. Monday - Friday. Please note that voicemails left after 4:00 p.m. may not be returned until the following business day.  We are closed weekends and major holidays. You have access to a nurse at all times for urgent questions. Please call the main number to the clinic Dept: [phone] and follow the prompts.   For any non-urgent questions, you may also contact your provider using MyChart. We now offer e-Visits for anyone 61 and older to request care online for non-urgent symptoms. For details visit [email or web address].   Also download the MyChart app! Go to the app store, search MyChart, open the app, select [place], and log in with your MyChart username and password.

## 2023-05-07 ENCOUNTER — Other Ambulatory Visit: Payer: Self-pay

## 2023-05-07 ENCOUNTER — Ambulatory Visit
Admission: RE | Admit: 2023-05-07 | Discharge: 2023-05-07 | Disposition: A | Discharge status: Home / Self Care | Payer: Self-pay | Source: Ambulatory Visit | Attending: Radiation Oncology | Admitting: Radiation Oncology

## 2023-05-07 DIAGNOSIS — C778 Secondary and unspecified malignant neoplasm of lymph nodes of multiple regions: Secondary | ICD-10-CM | POA: Insufficient documentation

## 2023-05-07 DIAGNOSIS — C3412 Malignant neoplasm of upper lobe, left bronchus or lung: Secondary | ICD-10-CM | POA: Insufficient documentation

## 2023-05-07 DIAGNOSIS — F17210 Nicotine dependence, cigarettes, uncomplicated: Secondary | ICD-10-CM | POA: Insufficient documentation

## 2023-05-07 DIAGNOSIS — Z510 Encounter for antineoplastic radiation therapy: Secondary | ICD-10-CM | POA: Insufficient documentation

## 2023-05-07 LAB — RAD ONC ARIA SESSION SUMMARY
Course Elapsed Days: 38
Plan Fractions Treated to Date: 25
Plan Prescribed Dose Per Fraction: 2 Gy
Plan Total Fractions Prescribed: 30
Plan Total Prescribed Dose: 60 Gy
Reference Point Dosage Given to Date: 50 Gy
Reference Point Session Dosage Given: 2 Gy
Session Number: 25

## 2023-05-08 ENCOUNTER — Other Ambulatory Visit: Payer: Self-pay

## 2023-05-08 ENCOUNTER — Ambulatory Visit
Admission: RE | Admit: 2023-05-08 | Discharge: 2023-05-08 | Disposition: A | Discharge status: Home / Self Care | Payer: Self-pay | Source: Ambulatory Visit | Attending: Radiation Oncology | Admitting: Radiation Oncology

## 2023-05-08 LAB — RAD ONC ARIA SESSION SUMMARY
Course Elapsed Days: 39
Plan Fractions Treated to Date: 26
Plan Prescribed Dose Per Fraction: 2 Gy
Plan Total Fractions Prescribed: 30
Plan Total Prescribed Dose: 60 Gy
Reference Point Dosage Given to Date: 52 Gy
Reference Point Session Dosage Given: 2 Gy
Session Number: 26

## 2023-05-11 ENCOUNTER — Ambulatory Visit
Admission: RE | Admit: 2023-05-11 | Discharge: 2023-05-11 | Disposition: A | Discharge status: Home / Self Care | Payer: Self-pay | Source: Ambulatory Visit | Attending: Radiation Oncology | Admitting: Radiation Oncology

## 2023-05-11 ENCOUNTER — Other Ambulatory Visit: Payer: Self-pay

## 2023-05-11 LAB — RAD ONC ARIA SESSION SUMMARY
Course Elapsed Days: 42
Plan Fractions Treated to Date: 27
Plan Prescribed Dose Per Fraction: 2 Gy
Plan Total Fractions Prescribed: 30
Plan Total Prescribed Dose: 60 Gy
Reference Point Dosage Given to Date: 54 Gy
Reference Point Session Dosage Given: 2 Gy
Session Number: 27

## 2023-05-12 ENCOUNTER — Inpatient Hospital Stay: Payer: Self-pay

## 2023-05-12 ENCOUNTER — Inpatient Hospital Stay: Payer: Self-pay | Attending: Internal Medicine

## 2023-05-12 ENCOUNTER — Other Ambulatory Visit: Payer: Self-pay

## 2023-05-12 ENCOUNTER — Ambulatory Visit
Admission: RE | Admit: 2023-05-12 | Discharge: 2023-05-12 | Disposition: A | Discharge status: Home / Self Care | Payer: Self-pay | Source: Ambulatory Visit | Attending: Radiation Oncology | Admitting: Radiation Oncology

## 2023-05-12 ENCOUNTER — Ambulatory Visit
Admission: RE | Admit: 2023-05-12 | Discharge: 2023-05-12 | Discharge status: Home / Self Care | Payer: Self-pay | Source: Ambulatory Visit | Attending: Radiation Oncology | Admitting: Radiation Oncology

## 2023-05-12 ENCOUNTER — Inpatient Hospital Stay (HOSPITAL_BASED_OUTPATIENT_CLINIC_OR_DEPARTMENT_OTHER): Payer: Self-pay | Admitting: Internal Medicine

## 2023-05-12 VITALS — BP 127/72 | HR 71 | Temp 97.9°F | Resp 16 | Ht 63.0 in | Wt 168.3 lb

## 2023-05-12 DIAGNOSIS — C3490 Malignant neoplasm of unspecified part of unspecified bronchus or lung: Secondary | ICD-10-CM

## 2023-05-12 DIAGNOSIS — C3412 Malignant neoplasm of upper lobe, left bronchus or lung: Secondary | ICD-10-CM | POA: Insufficient documentation

## 2023-05-12 DIAGNOSIS — F17210 Nicotine dependence, cigarettes, uncomplicated: Secondary | ICD-10-CM | POA: Insufficient documentation

## 2023-05-12 DIAGNOSIS — K209 Esophagitis, unspecified without bleeding: Secondary | ICD-10-CM | POA: Insufficient documentation

## 2023-05-12 DIAGNOSIS — R1310 Dysphagia, unspecified: Secondary | ICD-10-CM | POA: Insufficient documentation

## 2023-05-12 DIAGNOSIS — D72819 Decreased white blood cell count, unspecified: Secondary | ICD-10-CM | POA: Insufficient documentation

## 2023-05-12 LAB — CBC WITH DIFFERENTIAL (CANCER CENTER ONLY)
Abs Immature Granulocytes: 0.01 10*3/uL (ref 0.00–0.07)
Basophils Absolute: 0 10*3/uL (ref 0.0–0.1)
Basophils Relative: 1 %
Eosinophils Absolute: 0 10*3/uL (ref 0.0–0.5)
Eosinophils Relative: 2 %
HCT: 31.1 % — ABNORMAL LOW (ref 36.0–46.0)
Hemoglobin: 10.7 g/dL — ABNORMAL LOW (ref 12.0–15.0)
Immature Granulocytes: 1 %
Lymphocytes Relative: 27 %
Lymphs Abs: 0.4 10*3/uL — ABNORMAL LOW (ref 0.7–4.0)
MCH: 31.1 pg (ref 26.0–34.0)
MCHC: 34.4 g/dL (ref 30.0–36.0)
MCV: 90.4 fL (ref 80.0–100.0)
Monocytes Absolute: 0.1 10*3/uL (ref 0.1–1.0)
Monocytes Relative: 6 %
Neutro Abs: 1 10*3/uL — ABNORMAL LOW (ref 1.7–7.7)
Neutrophils Relative %: 63 %
Platelet Count: 108 10*3/uL — ABNORMAL LOW (ref 150–400)
RBC: 3.44 MIL/uL — ABNORMAL LOW (ref 3.87–5.11)
RDW: 14.4 % (ref 11.5–15.5)
WBC Count: 1.5 10*3/uL — ABNORMAL LOW (ref 4.0–10.5)
nRBC: 0 % (ref 0.0–0.2)

## 2023-05-12 LAB — CMP (CANCER CENTER ONLY)
ALT: 14 U/L (ref 0–44)
AST: 12 U/L — ABNORMAL LOW (ref 15–41)
Albumin: 4 g/dL (ref 3.5–5.0)
Alkaline Phosphatase: 67 U/L (ref 38–126)
Anion gap: 6 (ref 5–15)
BUN: 8 mg/dL (ref 8–23)
CO2: 30 mmol/L (ref 22–32)
Calcium: 9.2 mg/dL (ref 8.9–10.3)
Chloride: 104 mmol/L (ref 98–111)
Creatinine: 0.66 mg/dL (ref 0.44–1.00)
GFR, Estimated: >60 mL/min
Glucose, Bld: 97 mg/dL (ref 70–99)
Potassium: 3.3 mmol/L — ABNORMAL LOW (ref 3.5–5.1)
Sodium: 140 mmol/L (ref 135–145)
Total Bilirubin: 0.5 mg/dL (ref 0.0–1.2)
Total Protein: 6.9 g/dL (ref 6.5–8.1)

## 2023-05-12 LAB — RAD ONC ARIA SESSION SUMMARY
Course Elapsed Days: 43
Plan Fractions Treated to Date: 28
Plan Prescribed Dose Per Fraction: 2 Gy
Plan Total Fractions Prescribed: 30
Plan Total Prescribed Dose: 60 Gy
Reference Point Dosage Given to Date: 56 Gy
Reference Point Session Dosage Given: 2 Gy
Session Number: 28

## 2023-05-12 NOTE — Progress Notes (Signed)
 [facility] [phone]   Fax:([phone]  OFFICE PROGRESS NOTE  Patient, No Pcp Per No address on file  DIAGNOSIS: stage IIIa (T2a, N2, M0) non-small cell lung cancer, with unknown subtype secondary to insufficient tissue presented with left upper lobe lung mass in addition to left hilar and AP window lymphadenopathy diagnosed in October 2024.    PRIOR THERAPY: None    CURRENT THERAPY: Concurrent chemoradiation with carboplatin  for AUC of 2 and paclitaxel  45 mg/m2 chemotherapy. Status post 2 cycles. First dose on 03/30/23. Status post 6  cycles.  Last dose was given May 05, 2023.  INTERVAL HISTORY: Debra Lutz 63 y.o. female returns to the clinic today for Discussed the use of AI scribe software for clinical note transcription with the patient, who gave verbal consent to proceed.  History of Present Illness   The patient, a 63 year old diagnosed with stage 3A non-small cell lung cancer in October 2024, has been undergoing weekly carboplatin  and paclitaxel  chemotherapy and radiation therapy. She has completed six cycles of chemotherapy and is due to finish radiation therapy in a few days.  The patient reports feeling generally well, with only minor symptoms of a stuffy nose and a slight cough. She denies experiencing fever or chills. Her appetite remains good, with minimal weight loss over the course of treatment.  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 Despite taking Carafate  to manage this condition, the patient still reports slight pain when swallowing.  The patient's recent blood work showed a low white blood cell count, leading to the decision to skip the final chemotherapy session.  The patient's overall condition appears stable, with no new or worsening symptoms reported.       MEDICAL HISTORY: Past Medical History:  Diagnosis Date   COPD  (chronic obstructive pulmonary disease) (HCC)    Lung nodule 09/2022   hypermetabolic left upper lobe    ALLERGIES:  is allergic to wixela inhub [fluticasone-salmeterol].  MEDICATIONS:  Current Outpatient Medications  Medication Sig Dispense Refill   albuterol (ACCUNEB) 0.63 MG/3ML nebulizer solution Take 1 ampule by nebulization every 6 (six) hours as needed for wheezing.     albuterol (VENTOLIN HFA) 108 (90 Base) MCG/ACT inhaler Inhale into the lungs every 6 (six) hours as needed for wheezing or shortness of breath. Using at night     fexofenadine (ALLEGRA ALLERGY) 180 MG tablet Take 180 mg by mouth as needed.     ibuprofen (ADVIL) 200 MG tablet Take 200 mg by mouth every 4 (four) hours as needed for mild pain (pain score 1-3).     Multiple Vitamins-Minerals (MULTIVITAMINS THER. W/MINERALS) TABS tablet Take 1 tablet by mouth daily.     prochlorperazine  (COMPAZINE ) 10 MG tablet Take 1 tablet (10 mg total) by mouth every 6 (six) hours as needed for nausea or vomiting. 30 tablet 1   sucralfate  (CARAFATE ) 1 g tablet Take 1 tablet (1 g total) by mouth 4 (four) times daily -  with meals and at bedtime. Crush and dissolve in 10 mL of warm water prior to swallowing.  Take 20 to 30 minutes before meals 120 tablet 1   No current facility-administered medications for this visit.    SURGICAL HISTORY:  Past Surgical History:  Procedure Laterality Date   BRONCHIAL NEEDLE ASPIRATION BIOPSY  03/02/2023   Procedure: BRONCHIAL NEEDLE ASPIRATION BIOPSIES;  Surgeon: Brenna Adine CROME, DO;  Location: [facility];  Service:  Pulmonary;;   VIDEO BRONCHOSCOPY WITH ENDOBRONCHIAL ULTRASOUND Left 03/02/2023   Procedure: VIDEO BRONCHOSCOPY WITH ENDOBRONCHIAL ULTRASOUND;  Surgeon: Brenna Adine CROME, DO;  Location: [facility];  Service: Pulmonary;  Laterality: Left;    REVIEW OF SYSTEMS:  Constitutional: positive for fatigue Eyes: negative Ears, nose, mouth, throat, and face: positive for nasal  congestion Respiratory: positive for cough Cardiovascular: negative Gastrointestinal: positive for odynophagia Genitourinary:negative Integument/breast: negative Hematologic/lymphatic: negative Musculoskeletal:negative Neurological: negative Behavioral/Psych: negative Endocrine: negative Allergic/Immunologic: negative   PHYSICAL EXAMINATION: General appearance: alert, cooperative, and no distress Head: Normocephalic, without obvious abnormality, atraumatic Neck: no adenopathy, no JVD, supple, symmetrical, trachea midline, and thyroid  not enlarged, symmetric, no tenderness/mass/nodules Lymph nodes: Cervical, supraclavicular, and axillary nodes normal. Resp: clear to auscultation bilaterally Back: symmetric, no curvature. ROM normal. No CVA tenderness. Cardio: regular rate and rhythm, S1, S2 normal, no murmur, click, rub or gallop GI: soft, non-tender; bowel sounds normal; no masses,  no organomegaly Extremities: extremities normal, atraumatic, no cyanosis or edema Neurologic: Alert and oriented X 3, normal strength and tone. Normal symmetric reflexes. Normal coordination and gait  ECOG PERFORMANCE STATUS: 1 - Symptomatic but completely ambulatory  Blood pressure 127/72, pulse 71, temperature 97.9 F (36.6 C), temperature source Temporal, resp. rate 16, height 5' 3 (1.6 m), weight 168 lb 4.8 oz (76.3 kg), SpO2 100%.  LABORATORY DATA: Lab Results  Component Value Date   WBC 1.5 (L) 05/12/2023   HGB 10.7 (L) 05/12/2023   HCT 31.1 (L) 05/12/2023   MCV 90.4 05/12/2023   PLT 108 (L) 05/12/2023      Chemistry      Component Value Date/Time   NA 140 05/12/2023 0752   K 3.3 (L) 05/12/2023 0752   CL 104 05/12/2023 0752   CO2 30 05/12/2023 0752   BUN 8 05/12/2023 0752   CREATININE 0.66 05/12/2023 0752      Component Value Date/Time   CALCIUM 9.2 05/12/2023 0752   ALKPHOS 67 05/12/2023 0752   AST 12 (L) 05/12/2023 0752   ALT 14 05/12/2023 0752   BILITOT 0.5 05/12/2023 0752        RADIOGRAPHIC STUDIES: No results found.  ASSESSMENT AND PLAN: This is a very pleasant 63 years old white female with stage IIIa (T2a, N2, M0) non-small cell lung cancer, with unknown subtype secondary to insufficient tissue presented with left upper lobe lung mass in addition to left hilar and AP window lymphadenopathy diagnosed in October 2024.  She is currently undergoing a course of concurrent chemoradiation with weekly carboplatin  for AUC of 2 and paclitaxel  45 Mg/M2 status post 6 cycles of treatment.  The patient has been tolerating this treatment fairly well.    Stage IIIA Non-Small Cell Lung Cancer Diagnosed in October 2024. Currently undergoing weekly carboplatin  and paclitaxel , and radiation therapy. Completed six cycles of chemotherapy and finishing radiation on May 14, 2023. Reports minor symptoms including nasal congestion and mild cough. No fever, chills, chest pain, or significant dyspnea. Slight weight gain noted. Radiation-induced esophagitis causing odynophagia, managed with Carafate . Leukopenia observed, leading to the decision to skip the last chemotherapy session. Explained that the skipped session will not be replaced and emphasized monitoring recovery over the next four weeks. Chest scan scheduled in four weeks to assess treatment efficacy. - Continue Carafate  for radiation-induced esophagitis - Skip the last chemotherapy session due to leukopenia - Schedule a chest scan in four weeks to assess treatment efficacy - Follow up in four weeks with scan results - Advise to call with any  concerns or if no call is received about the scan  Leukopenia Low white blood count observed today, leading to the decision to skip the last chemotherapy session. Emphasized the importance of allowing time for recovery and monitoring blood counts. - Skip the last chemotherapy session due to leukopenia - Monitor blood counts and reassess in four weeks  Radiation-Induced  Esophagitis Inflammation of the esophagus due to radiation therapy, causing odynophagia. Managed with Carafate , but still experiences slight pain when swallowing. - Continue Carafate  for esophagitis.   The patient was advised to call immediately if she has any other concerning symptoms in the interval. The patient voices understanding of current disease status and treatment options and is in agreement with the current care plan.  All questions were answered. The patient knows to call the clinic with any problems, questions or concerns. We can certainly see the patient much sooner if necessary.  The total time spent in the appointment was 30 minutes.  Disclaimer: This note was dictated with voice recognition software. Similar sounding words can inadvertently be transcribed and may not be corrected upon review.

## 2023-05-13 ENCOUNTER — Other Ambulatory Visit: Payer: Self-pay

## 2023-05-13 ENCOUNTER — Ambulatory Visit
Admission: RE | Admit: 2023-05-13 | Discharge: 2023-05-13 | Disposition: A | Discharge status: Home / Self Care | Payer: Self-pay | Source: Ambulatory Visit | Attending: Radiation Oncology | Admitting: Radiation Oncology

## 2023-05-13 ENCOUNTER — Ambulatory Visit: Payer: Self-pay

## 2023-05-13 LAB — RAD ONC ARIA SESSION SUMMARY
Course Elapsed Days: 44
Plan Fractions Treated to Date: 29
Plan Prescribed Dose Per Fraction: 2 Gy
Plan Total Fractions Prescribed: 30
Plan Total Prescribed Dose: 60 Gy
Reference Point Dosage Given to Date: 58 Gy
Reference Point Session Dosage Given: 2 Gy
Session Number: 29

## 2023-05-14 ENCOUNTER — Ambulatory Visit
Admission: RE | Admit: 2023-05-14 | Discharge: 2023-05-14 | Disposition: A | Discharge status: Home / Self Care | Payer: Self-pay | Source: Ambulatory Visit | Attending: Radiation Oncology | Admitting: Radiation Oncology

## 2023-05-14 ENCOUNTER — Other Ambulatory Visit: Payer: Self-pay

## 2023-05-14 LAB — RAD ONC ARIA SESSION SUMMARY
Course Elapsed Days: 45
Plan Fractions Treated to Date: 30
Plan Prescribed Dose Per Fraction: 2 Gy
Plan Total Fractions Prescribed: 30
Plan Total Prescribed Dose: 60 Gy
Reference Point Dosage Given to Date: 60 Gy
Reference Point Session Dosage Given: 2 Gy
Session Number: 30

## 2023-05-15 NOTE — Radiation Completion Notes (Addendum)
  Radiation Oncology         [phone] ________________________________  Name: Debra Lutz MRN: [id number]  Date of Service: 05/14/2023  DOB: March 12, 1961  End of Treatment Note  Diagnosis: Stage IIIA, cT2aN2M0 NSCLC, unknown subtype, of the LUL.   Intent: Curative     ==========DELIVERED PLANS==========  First Treatment Date: 2023-03-30 Last Treatment Date: 2023-05-14   Plan Name: Lung_L Site: Lung, Left Technique: 3D Mode: Photon Dose Per Fraction: 2 Gy Prescribed Dose (Delivered / Prescribed): 60 Gy / 60 Gy Prescribed Fxs (Delivered / Prescribed): 30 / 30     ==========ON TREATMENT VISIT DATES========== 2023-03-31, 2023-04-07, 2023-04-14, 2023-04-20, 2023-04-24, 2023-05-05, 2023-05-12    See weekly On Treatment Notes in Epic for details in the Media tab (listed as Progress notes on the On Treatment Visit Dates listed above). The patient tolerated radiation. She developed fatigue and esophagitis during therapy.  The patient will follow up in one month. She will continue follow up with Dr. Sherrod as well.      Donald KYM Husband, PAC

## 2023-05-26 ENCOUNTER — Encounter: Payer: Self-pay | Admitting: Gastroenterology

## 2023-06-10 ENCOUNTER — Inpatient Hospital Stay: Payer: Self-pay | Attending: Internal Medicine

## 2023-06-10 ENCOUNTER — Ambulatory Visit (HOSPITAL_COMMUNITY)
Admission: RE | Admit: 2023-06-10 | Discharge: 2023-06-10 | Disposition: A | Discharge status: Home / Self Care | Payer: Self-pay | Source: Ambulatory Visit | Attending: Internal Medicine | Admitting: Internal Medicine

## 2023-06-10 ENCOUNTER — Inpatient Hospital Stay (HOSPITAL_BASED_OUTPATIENT_CLINIC_OR_DEPARTMENT_OTHER): Payer: Self-pay | Admitting: Internal Medicine

## 2023-06-10 VITALS — BP 119/59 | HR 69 | Temp 98.0°F | Resp 17 | Ht 63.0 in | Wt 167.0 lb

## 2023-06-10 DIAGNOSIS — C3490 Malignant neoplasm of unspecified part of unspecified bronchus or lung: Secondary | ICD-10-CM

## 2023-06-10 DIAGNOSIS — I2510 Atherosclerotic heart disease of native coronary artery without angina pectoris: Secondary | ICD-10-CM | POA: Insufficient documentation

## 2023-06-10 DIAGNOSIS — C3412 Malignant neoplasm of upper lobe, left bronchus or lung: Secondary | ICD-10-CM | POA: Insufficient documentation

## 2023-06-10 DIAGNOSIS — I700 Atherosclerosis of aorta: Secondary | ICD-10-CM | POA: Insufficient documentation

## 2023-06-10 DIAGNOSIS — F17210 Nicotine dependence, cigarettes, uncomplicated: Secondary | ICD-10-CM | POA: Insufficient documentation

## 2023-06-10 DIAGNOSIS — Z5112 Encounter for antineoplastic immunotherapy: Secondary | ICD-10-CM | POA: Insufficient documentation

## 2023-06-10 DIAGNOSIS — Z79620 Long term (current) use of immunosuppressive biologic: Secondary | ICD-10-CM | POA: Insufficient documentation

## 2023-06-10 LAB — CMP (CANCER CENTER ONLY)
ALT: 18 U/L (ref 0–44)
AST: 17 U/L (ref 15–41)
Albumin: 4.4 g/dL (ref 3.5–5.0)
Alkaline Phosphatase: 84 U/L (ref 38–126)
Anion gap: 6 (ref 5–15)
BUN: 12 mg/dL (ref 8–23)
CO2: 31 mmol/L (ref 22–32)
Calcium: 9.7 mg/dL (ref 8.9–10.3)
Chloride: 102 mmol/L (ref 98–111)
Creatinine: 0.7 mg/dL (ref 0.44–1.00)
GFR, Estimated: >60 mL/min (ref >60)
Glucose, Bld: 102 mg/dL — ABNORMAL HIGH (ref 70–99)
Potassium: 3.9 mmol/L (ref 3.5–5.1)
Sodium: 139 mmol/L (ref 135–145)
Total Bilirubin: 0.5 mg/dL (ref 0.0–1.2)
Total Protein: 7.3 g/dL (ref 6.5–8.1)

## 2023-06-10 LAB — CBC WITH DIFFERENTIAL (CANCER CENTER ONLY)
Abs Immature Granulocytes: 0.02 10*3/uL (ref 0.00–0.07)
Basophils Absolute: 0.1 10*3/uL (ref 0.0–0.1)
Basophils Relative: 1 %
Eosinophils Absolute: 0.2 10*3/uL (ref 0.0–0.5)
Eosinophils Relative: 3 %
HCT: 35.7 % — ABNORMAL LOW (ref 36.0–46.0)
Hemoglobin: 11.9 g/dL — ABNORMAL LOW (ref 12.0–15.0)
Immature Granulocytes: 0 %
Lymphocytes Relative: 13 %
Lymphs Abs: 0.7 10*3/uL (ref 0.7–4.0)
MCH: 32 pg (ref 26.0–34.0)
MCHC: 33.3 g/dL (ref 30.0–36.0)
MCV: 96 fL (ref 80.0–100.0)
Monocytes Absolute: 0.5 10*3/uL (ref 0.1–1.0)
Monocytes Relative: 9 %
Neutro Abs: 3.7 10*3/uL (ref 1.7–7.7)
Neutrophils Relative %: 74 %
Platelet Count: 237 10*3/uL (ref 150–400)
RBC: 3.72 MIL/uL — ABNORMAL LOW (ref 3.87–5.11)
RDW: 15.9 % — ABNORMAL HIGH (ref 11.5–15.5)
WBC Count: 5.1 10*3/uL (ref 4.0–10.5)
nRBC: 0 % (ref 0.0–0.2)

## 2023-06-10 MED ORDER — IOHEXOL 300 MG/ML  SOLN
75.0000 mL | Freq: Once | INTRAMUSCULAR | Status: AC | PRN
  Administered 2023-06-10: 75 mL via INTRAVENOUS

## 2023-06-10 NOTE — Progress Notes (Signed)
 [facility] Telephone:(336) [phone]   Fax:(336) [phone]  OFFICE PROGRESS NOTE  Patient, No Pcp Per No address on file  DIAGNOSIS: stage IIIa (T2a, N2, M0) non-small cell lung cancer, with unknown subtype secondary to insufficient tissue presented with left upper lobe lung mass in addition to left hilar and AP window lymphadenopathy diagnosed in October 2024.    PRIOR THERAPY: Concurrent chemoradiation with carboplatin  for AUC of 2 and paclitaxel  45 mg/m2 chemotherapy. Status post 6 cycles. First dose on 03/30/23. Status post 6  cycles.  Last dose was given May 05, 2023.   CURRENT THERAPY: Consolidation treatment with immunotherapy with Imfinzi  1500 Mg IV every 4 weeks.  First dose June 17, 2023.  INTERVAL HISTORY: Debra Lutz 63 y.o. female returns to the clinic today for follow-up visit.Discussed the use of AI scribe software for clinical note transcription with the patient, who gave verbal consent to proceed.  History of Present Illness   Debra Lutz is a 63 year old female with stage 3A non-small cell lung cancer who presents for follow-up after chemotherapy and radiation treatment.  She was diagnosed with stage 3A non-small cell lung cancer on March 30, 2023. She completed a treatment regimen of weekly carboplatin  and paclitaxel  chemotherapy, with the last dose on May 05, 2023, and concurrent radiation therapy, which concluded on May 14, 2023.  Since completing her treatment, she feels 'good' with no new chest pain or shortness of breath. She developed a mild cough three days ago, which she attributes to a common cold. Her swallowing has returned to normal, indicating improvement from any prior dysphagia.  She is currently struggling with obtaining insurance coverage, having been denied multiple times despite submitting necessary documentation, including proof of her cancer diagnosis. 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       MEDICAL HISTORY: Past Medical History:  Diagnosis Date   COPD (chronic obstructive pulmonary disease) (HCC)    Lung nodule 09/2022   hypermetabolic left upper lobe    ALLERGIES:  is allergic to wixela inhub [fluticasone-salmeterol].  MEDICATIONS:  Current Outpatient Medications  Medication Sig Dispense Refill   albuterol (ACCUNEB) 0.63 MG/3ML nebulizer solution Take 1 ampule by nebulization every 6 (six) hours as needed for wheezing.     albuterol (VENTOLIN HFA) 108 (90 Base) MCG/ACT inhaler Inhale into the lungs every 6 (six) hours as needed for wheezing or shortness of breath. Using at night     fexofenadine (ALLEGRA ALLERGY) 180 MG tablet Take 180 mg by mouth as needed.     ibuprofen (ADVIL) 200 MG tablet Take 200 mg by mouth every 4 (four) hours as needed for mild pain (pain score 1-3).     Multiple Vitamins-Minerals (MULTIVITAMINS THER. W/MINERALS) TABS tablet Take 1 tablet by mouth daily.     sucralfate  (CARAFATE ) 1 g tablet Take 1 tablet (1 g total) by mouth 4 (four) times daily -  with meals and at bedtime. Crush and dissolve in 10 mL of warm water prior to swallowing.  Take 20 to 30 minutes before meals 120 tablet 1   No current facility-administered medications for this visit.    SURGICAL HISTORY:  Past Surgical History:  Procedure Laterality Date   BRONCHIAL NEEDLE ASPIRATION BIOPSY  03/02/2023   Procedure: BRONCHIAL NEEDLE ASPIRATION BIOPSIES;  Surgeon: Brenna Adine CROME, DO;  Location: [facility];  Service: Pulmonary;;   VIDEO BRONCHOSCOPY WITH ENDOBRONCHIAL ULTRASOUND Left 03/02/2023   Procedure: VIDEO BRONCHOSCOPY WITH ENDOBRONCHIAL  ULTRASOUND;  Surgeon: Brenna Adine CROME, DO;  Location: [facility];  Service: Pulmonary;  Laterality: Left;    REVIEW OF SYSTEMS:  Constitutional: negative Eyes: negative Ears, nose, mouth, throat, and face: negative Respiratory: negative Cardiovascular: negative Gastrointestinal:  negative Genitourinary:negative Integument/breast: negative Hematologic/lymphatic: negative Musculoskeletal:negative Neurological: negative Behavioral/Psych: negative Endocrine: negative Allergic/Immunologic: negative   PHYSICAL EXAMINATION: General appearance: alert, cooperative, and no distress Head: Normocephalic, without obvious abnormality, atraumatic Neck: no adenopathy, no JVD, supple, symmetrical, trachea midline, and thyroid  not enlarged, symmetric, no tenderness/mass/nodules Lymph nodes: Cervical, supraclavicular, and axillary nodes normal. Resp: clear to auscultation bilaterally Back: symmetric, no curvature. ROM normal. No CVA tenderness. Cardio: regular rate and rhythm, S1, S2 normal, no murmur, click, rub or gallop GI: soft, non-tender; bowel sounds normal; no masses,  no organomegaly Extremities: extremities normal, atraumatic, no cyanosis or edema Neurologic: Alert and oriented X 3, normal strength and tone. Normal symmetric reflexes. Normal coordination and gait  ECOG PERFORMANCE STATUS: 1 - Symptomatic but completely ambulatory  Blood pressure (!) 119/59, pulse 69, temperature 98 F (36.7 C), temperature source Temporal, resp. rate 17, height 5' 3 (1.6 m), weight 167 lb (75.8 kg), SpO2 98%.  LABORATORY DATA: Lab Results  Component Value Date   WBC 5.1 06/10/2023   HGB 11.9 (L) 06/10/2023   HCT 35.7 (L) 06/10/2023   MCV 96.0 06/10/2023   PLT 237 06/10/2023      Chemistry      Component Value Date/Time   NA 140 05/12/2023 0752   K 3.3 (L) 05/12/2023 0752   CL 104 05/12/2023 0752   CO2 30 05/12/2023 0752   BUN 8 05/12/2023 0752   CREATININE 0.66 05/12/2023 0752      Component Value Date/Time   CALCIUM 9.2 05/12/2023 0752   ALKPHOS 67 05/12/2023 0752   AST 12 (L) 05/12/2023 0752   ALT 14 05/12/2023 0752   BILITOT 0.5 05/12/2023 0752       RADIOGRAPHIC STUDIES: No results found.  ASSESSMENT AND PLAN: This is a very pleasant 63 years old white  female with stage IIIa (T2a, N2, M0) non-small cell lung cancer, with unknown subtype secondary to insufficient tissue presented with left upper lobe lung mass in addition to left hilar and AP window lymphadenopathy diagnosed in October 2024.  She underwent a course of concurrent chemoradiation with weekly carboplatin  for AUC of 2 and paclitaxel  45 Mg/M2 status post 6 cycles of treatment.  The patient has been tolerating this treatment fairly well. She had repeat CT scan of the chest performed recently.  The final report is pending but I personally and independently reviewed the scan images and I can see some improvement of her disease.    Stage IIIA Non-Small Cell Lung Cancer Diagnosed on March 30, 2023. Completed six cycles of carboplatin  and paclitaxel  chemotherapy (last dose: May 05, 2023) and radiation therapy (completed May 14, 2023). Recent chest CT shows improvement. Plan to initiate durvalumab  (Imfinzi ) to enhance immune response and delay recurrence. - Start durvalumab  (Imfinzi ) 10 mg/kg IV every 2 weeks for up to 1 year - Schedule next appointment in 5 weeks - Review CT scan report and adjust plan if necessary - Coordinate with social worker to address insurance issues.   She was advised to call immediately if she has any other concerning symptoms in the interval. The patient voices understanding of current disease status and treatment options and is in agreement with the current care plan.  All questions were answered. The patient knows to call the  clinic with any problems, questions or concerns. We can certainly see the patient much sooner if necessary.  The total time spent in the appointment was 30 minutes.  Disclaimer: This note was dictated with voice recognition software. Similar sounding words can inadvertently be transcribed and may not be corrected upon review.

## 2023-06-11 ENCOUNTER — Encounter: Payer: Self-pay | Admitting: Internal Medicine

## 2023-06-11 ENCOUNTER — Telehealth: Payer: Self-pay | Admitting: Internal Medicine

## 2023-06-11 ENCOUNTER — Inpatient Hospital Stay: Payer: Self-pay | Admitting: Licensed Clinical Social Worker

## 2023-06-11 DIAGNOSIS — C3412 Malignant neoplasm of upper lobe, left bronchus or lung: Secondary | ICD-10-CM

## 2023-06-12 ENCOUNTER — Encounter: Payer: Self-pay | Admitting: Radiation Oncology

## 2023-06-12 ENCOUNTER — Other Ambulatory Visit: Payer: Self-pay

## 2023-06-13 NOTE — Progress Notes (Deleted)
  [facility] OFFICE PROGRESS NOTE  Patient, No Pcp Per No address on file  DIAGNOSIS: Stage IIIa (T2a, N2, M0) non-small cell lung cancer, with unknown subtype secondary to insufficient tissue presented with left upper lobe lung mass in addition to left hilar and AP window lymphadenopathy diagnosed in October 2024.   PRIOR THERAPY: Concurrent chemoradiation with carboplatin for AUC of 2 and paclitaxel 45 mg/m2 chemotherapy. Status post 6 cycles. First dose on 03/30/23. Status post 6  cycles.  Last dose was given May 05, 2023.   CURRENT THERAPY: Consolidation treatment with immunotherapy with Imfinzi 1500 Mg IV every 4 weeks. First dose June 17, 2023.   INTERVAL HISTORY: Debra Lutz 63 y.o. female returns for *** regular *** visit for followup of ***   MEDICAL HISTORY: Past Medical History:  Diagnosis Date   COPD (chronic obstructive pulmonary disease) (HCC)    History of radiation therapy    Left Lung SBRT- 03/30/23-05/14/23- Dr. Antony Blackbird   Lung nodule 09/2022   hypermetabolic left upper lobe    ALLERGIES:  is allergic to wixela inhub [fluticasone-salmeterol].  MEDICATIONS:  Current Outpatient Medications  Medication Sig Dispense Refill   albuterol (ACCUNEB) 0.63 MG/3ML nebulizer solution Take 1 ampule by nebulization every 6 (six) hours as needed for wheezing.     albuterol (VENTOLIN HFA) 108 (90 Base) MCG/ACT inhaler Inhale into the lungs every 6 (six) hours as needed for wheezing or shortness of breath. Using at night     fexofenadine (ALLEGRA ALLERGY) 180 MG tablet Take 180 mg by mouth as needed.     ibuprofen (ADVIL) 200 MG tablet Take 200 mg by mouth every 4 (four) hours as needed for mild pain (pain score 1-3).     Multiple Vitamins-Minerals (MULTIVITAMINS THER. W/MINERALS) TABS tablet Take 1 tablet by mouth daily.     sucralfate (CARAFATE) 1 g tablet Take 1 tablet (1 g total) by mouth 4 (four) times daily -  with meals and at bedtime. Crush and  dissolve in 10 mL of warm water prior to swallowing.  Take 20 to 30 minutes before meals 120 tablet 1   No current facility-administered medications for this visit.    SURGICAL HISTORY:  Past Surgical History:  Procedure Laterality Date   BRONCHIAL NEEDLE ASPIRATION BIOPSY  03/02/2023   Procedure: BRONCHIAL NEEDLE ASPIRATION BIOPSIES;  Surgeon: Josephine Igo, DO;  Location: [facility];  Service: Pulmonary;;   VIDEO BRONCHOSCOPY WITH ENDOBRONCHIAL ULTRASOUND Left 03/02/2023   Procedure: VIDEO BRONCHOSCOPY WITH ENDOBRONCHIAL ULTRASOUND;  Surgeon: Josephine Igo, DO;  Location: [facility];  Service: Pulmonary;  Laterality: Left;    REVIEW OF SYSTEMS:   Review of Systems  Constitutional: Negative for appetite change, chills, fatigue, fever and unexpected weight change.  HENT:   Negative for mouth sores, nosebleeds, sore throat and trouble swallowing.   Eyes: Negative for eye problems and icterus.  Respiratory: Negative for cough, hemoptysis, shortness of breath and wheezing.   Cardiovascular: Negative for chest pain and leg swelling.  Gastrointestinal: Negative for abdominal pain, constipation, diarrhea, nausea and vomiting.  Genitourinary: Negative for bladder incontinence, difficulty urinating, dysuria, frequency and hematuria.   Musculoskeletal: Negative for back pain, gait problem, neck pain and neck stiffness.  Skin: Negative for itching and rash.  Neurological: Negative for dizziness, extremity weakness, gait problem, headaches, light-headedness and seizures.  Hematological: Negative for adenopathy. Does not bruise/bleed easily.  Psychiatric/Behavioral: Negative for confusion, depression and sleep disturbance. The patient is not nervous/anxious.     PHYSICAL  EXAMINATION:  There were no vitals taken for this visit.  ECOG PERFORMANCE STATUS: {CHL ONC ECOG [id number]  Physical Exam  Constitutional: Oriented to person, place, and time and well-developed, well-nourished,  and in no distress. No distress.  HENT:  Head: Normocephalic and atraumatic.  Mouth/Throat: Oropharynx is clear and moist. No oropharyngeal exudate.  Eyes: Conjunctivae are normal. Right eye exhibits no discharge. Left eye exhibits no discharge. No scleral icterus.  Neck: Normal range of motion. Neck supple.  Cardiovascular: Normal rate, regular rhythm, normal heart sounds and intact distal pulses.   Pulmonary/Chest: Effort normal and breath sounds normal. No respiratory distress. No wheezes. No rales.  Abdominal: Soft. Bowel sounds are normal. Exhibits no distension and no mass. There is no tenderness.  Musculoskeletal: Normal range of motion. Exhibits no edema.  Lymphadenopathy:    No cervical adenopathy.  Neurological: Alert and oriented to person, place, and time. Exhibits normal muscle tone. Gait normal. Coordination normal.  Skin: Skin is warm and dry. No rash noted. Not diaphoretic. No erythema. No pallor.  Psychiatric: Mood, memory and judgment normal.  Vitals reviewed.  LABORATORY DATA: Lab Results  Component Value Date   WBC 5.1 06/10/2023   HGB 11.9 (L) 06/10/2023   HCT 35.7 (L) 06/10/2023   MCV 96.0 06/10/2023   PLT 237 06/10/2023      Chemistry      Component Value Date/Time   NA 139 06/10/2023 0902   K 3.9 06/10/2023 0902   CL 102 06/10/2023 0902   CO2 31 06/10/2023 0902   BUN 12 06/10/2023 0902   CREATININE 0.70 06/10/2023 0902      Component Value Date/Time   CALCIUM 9.7 06/10/2023 0902   ALKPHOS 84 06/10/2023 0902   AST 17 06/10/2023 0902   ALT 18 06/10/2023 0902   BILITOT 0.5 06/10/2023 0902       RADIOGRAPHIC STUDIES:  No results found.   ASSESSMENT/PLAN:  No problem-specific Assessment & Plan notes found for this encounter.   No orders of the defined types were placed in this encounter.    I spent {CHL ONC TIME VISIT - ZDGUY:[id number]} counseling the patient face to face. The total time spent in the appointment was {CHL ONC TIME VISIT -  GLOVF:[id number]}.  Kalijah Westfall L Avanelle Pixley, PA-C 06/13/23

## 2023-06-14 NOTE — Progress Notes (Signed)
  Radiation Oncology         [phone] ________________________________  Name: Debra Lutz MRN: [id number]  Date: 06/15/2023  DOB: 05/10/1960  End of Treatment Note  Diagnosis:  The primary encounter diagnosis was Nodule of left lung. A diagnosis of Primary cancer of left upper lobe of lung (HCC) was also pertinent to this visit.   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 nodal involvement, Stage IIIA   Cancer Staging  Primary cancer of left upper lobe of lung [facility]) Staging form: Lung, AJCC 8th Edition - Clinical stage from 03/18/2023: Stage IIIA (cT2a, cN2, cM0) - Signed by Retta Caster, MD on 03/18/2023  Indication for treatment: Curative        Radiation treatment dates:  First Treatment Date: 2023-03-30 - Last Treatment Date: 2023-05-14  Site/Dose/Technique/Mode:   Site: Lung, Left Technique: 3D Mode: Photon Dose Per Fraction: 2 Gy Prescribed Dose (Delivered / Prescribed): 60 Gy / 60 Gy Prescribed Fxs (Delivered / Prescribed): 30 / 30  Narrative: The patient tolerated radiation treatment relatively well. During her final weekly treatment check on 05/12/23, the patient endorsed fatigue, skin irritation to the left back area (managed with sonofine), and a productive cough. She otherwise reported improvement in her swallowing with Carafate   Plan: The patient has completed radiation treatment. The patient will return to radiation oncology clinic for routine followup in one month. I advised them to call or return sooner if they have any questions or concerns related to their recovery or treatment.  -----------------------------------  Noralee Beam, PhD, MD  This document serves as a record of services personally performed by Retta Caster, MD. It was created on his behalf by Aleta Anda, a trained medical scribe. The creation of this record is based on the scribe's personal observations and the provider's statements to them. This document has been checked and  approved by the attending provider.

## 2023-06-14 NOTE — Progress Notes (Signed)
 Radiation Oncology         [phone] ________________________________  Name: Debra Lutz MRN: [id number]  Date: 06/15/2023  DOB: [birth year]  Follow-Up Visit Note  CC: Patient, No Pcp Per  Antonio Baumgarten L, DO    ICD-10-CM   1. Primary cancer of left upper lobe of lung (HCC) [C34.12]  C34.12       Diagnosis: The primary encounter diagnosis was Nodule of left lung. A diagnosis of Primary cancer of left upper lobe of lung (HCC) was also pertinent to this visit.   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 nodal involvement, Stage IIIA   Cancer Staging  Primary cancer of left upper lobe of lung [facility]) Staging form: Lung, AJCC 8th Edition - Clinical stage from 03/18/2023: Stage IIIA (cT2a, cN2, cM0) - Signed by Retta Caster, MD on 03/18/2023  Interval Since Last Radiation: 1 month and 1 day  Indication for treatment: Curative       Radiation treatment dates:  First Treatment Date: 2023-03-30 - Last Treatment Date: 2023-05-14 Site/Dose/Technique/Mode:  Site: Lung, Left Technique: 3D Mode: Photon Dose Per Fraction: 2 Gy Prescribed Dose (Delivered / Prescribed): 60 Gy / 60 Gy Prescribed Fxs (Delivered / Prescribed): 30 / 30  Narrative:  The patient returns today for routine follow-up. She tolerated radiation treatment relatively well. During her final weekly treatment check on 05/12/23, the patient endorsed fatigue, skin irritation to the left back area (managed with sonofine), and a productive cough. She otherwise reported improvement in her swallowing with Carafate .   To review, she was seen in consultation by Dr. Marguerita Shih on 03/19/23 (the day after her initial consultation date with us ), and opted to proceed with concurrent chemotherapy consisting of Taxol  and Carboplatin  on 03/31/23. Chemo toxicities encountered by the patient during treatment included: decreased appetite, mild constipation, and some esophageal symptoms which were managed with carafate  as noted above.  She otherwise tolerated chemotherapy relatively well, and completed her 6th and final cycle of chemotherapy on 05/12/23.         During her most recent follow-up visit with Dr. Marguerita Shih on 06/10/23, she agreed to proceed with maintenance Imfinzi . Imfinzi  will be administered q2 weeks for up to 1 year.   Her most recent chest CT from 06/10/23 demonstrates: post treatment changes within the LUL and a decrease in size of the underlying treated lung lesion. No signs of mediastinal or hilar adenopathy were seen. The enlarged lymph node seen on previous imaging has decreased in size. A healing fracture of the sternal manubrium was also appreciated.       Of note: She had an MRI of the brain performed shortly after he initial consultation date on 03/26/23 which showed no acute intracranial processes or evidence of intracranial metastatic disease.       Today the patient reports to be doing well overall. She does have a residual cough from a respiratory illness she experiences last week. This continues to improve. She otherwise denies any shortness of breath with exertion, hemoptysis, or chest pain. Patient states she is smoking approximately 4 cigarettes per day in comparison to her her 24 cigarettes day when she was first diagnosed with lung cancer. She is using nicotine patches to aid with this.              Allergies:  is allergic to wixela inhub [fluticasone-salmeterol].  Meds: Current Outpatient Medications  Medication Sig Dispense Refill   albuterol (ACCUNEB) 0.63 MG/3ML nebulizer solution Take 1 ampule by nebulization every  6 (six) hours as needed for wheezing.     albuterol (VENTOLIN HFA) 108 (90 Base) MCG/ACT inhaler Inhale into the lungs every 6 (six) hours as needed for wheezing or shortness of breath. Using at night     fexofenadine (ALLEGRA ALLERGY) 180 MG tablet Take 180 mg by mouth as needed.     ibuprofen (ADVIL) 200 MG tablet Take 200 mg by mouth every 4 (four) hours as needed for mild  pain (pain score 1-3).     Multiple Vitamins-Minerals (MULTIVITAMINS THER. W/MINERALS) TABS tablet Take 1 tablet by mouth daily.     sucralfate  (CARAFATE ) 1 g tablet Take 1 tablet (1 g total) by mouth 4 (four) times daily -  with meals and at bedtime. Crush and dissolve in 10 mL of warm water prior to swallowing.  Take 20 to 30 minutes before meals (Patient not taking: Reported on 06/15/2023) 120 tablet 1   No current facility-administered medications for this encounter.    Physical Findings: The patient is in no acute distress. Patient is alert and oriented.  height is 5\' 3"  (1.6 m) and weight is 164 lb 4 oz (74.5 kg). Her temporal temperature is 97.7 F (36.5 C). Her blood pressure is 117/67 and her pulse is 72. Her respiration is 18 and oxygen saturation is 99%. .  No significant changes. Rhonci auscultated bilaterally. Heart has regular rate and rhythm. No palpable cervical, supraclavicular, or axillary adenopathy.    Lab Findings: Lab Results  Component Value Date   WBC 5.1 06/10/2023   HGB 11.9 (L) 06/10/2023   HCT 35.7 (L) 06/10/2023   MCV 96.0 06/10/2023   PLT 237 06/10/2023    Radiographic Findings: CT Chest W Contrast Result Date: 06/15/2023 CLINICAL DATA:  Restaging non-small cell lung cancer. * Tracking Code: BO *. EXAM: CT CHEST WITH CONTRAST TECHNIQUE: Multidetector CT imaging of the chest was performed during intravenous contrast administration. RADIATION DOSE REDUCTION: This exam was performed according to the departmental dose-optimization program which includes automated exposure control, adjustment of the mA and/or kV according to patient size and/or use of iterative reconstruction technique. CONTRAST:  75mL OMNIPAQUE  IOHEXOL  300 MG/ML  SOLN COMPARISON:  CT chest 02/26/2023 and PET-CT 02/05/2023 FINDINGS: Cardiovascular: Heart size is normal. No pericardial effusion. Aortic atherosclerosis and coronary artery calcification. Mediastinum/Nodes: Thyroid  gland, trachea, and  esophagus appear normal. No enlarged mediastinal or hilar lymph nodes. Index AP window lymph node measures 0.7 cm short axis, image 49/2. Previously 1.1 cm. Lungs/Pleura: No pleural effusion. Post treatment changes within the left upper lobe with new bandlike area of consolidation within the anteromedial left upper lobe. The underlying treated lung lesion with spiculated margins measures 2.3 x 1.8 cm, image 43/8. On the previous exam this measured 3.6 by 2.6 cm. Upper Abdomen: No acute abnormality. Musculoskeletal: No acute or suspicious osseous findings. Healing fracture of the sternal manubrium. IMPRESSION: 1. Post treatment changes within the left upper lobe with new bandlike area of consolidation within the anteromedial left upper lobe. The underlying treated lung lesion with spiculated margins measures 2.3 x 1.8 cm. On the previous exam this measured 3.6 x 2.6 cm. 2. No signs of mediastinal or hilar adenopathy. Index tracer avid AP window lymph node has decreased in size from previous exam. 3. Healing fracture of the sternal manubrium. 4. Coronary artery calcifications. 5.  Aortic Atherosclerosis (ICD10-I70.0). Electronically Signed   By: Kimberley Penman M.D.   On: 06/15/2023 11:41    Impression: Non-small cell carcinoma of the left upper lung  with nodal involvement, Stage IIIA  The patient is doing well overall today and has recovered from the effects of radiation treatment. Most recent imaging demonstrated a positive response to treatment. She continues to work on smoking cessation with the use of nicotine patches. I congratulated the patient on her efforts today.   Plan:  Patient will continue follow-up under the care of Dr. Marguerita Shih. She is scheduled for her first immunotherapy infusion on 06/17/2023. Radiation follow-up PRN. We appreciate the opportunity to participate in this patient' care. She knows to call with any questions or concerns in the meantime.    20 minutes of total time was spent for  this patient encounter, including preparation, face-to-face counseling with the patient and coordination of care, physical exam, and documentation of the encounter. ____________________________________   Julio Ohm, PA-C  This document serves as a record of services personally performed by Julio Ohm, PA-C. It was created on his behalf by Aleta Anda, a trained medical scribe. The creation of this record is based on the scribe's personal observations and the provider's statements to them. This document has been checked and approved by the attending provider.

## 2023-06-15 ENCOUNTER — Encounter: Payer: Self-pay | Admitting: Physician Assistant

## 2023-06-15 ENCOUNTER — Ambulatory Visit
Admission: RE | Admit: 2023-06-15 | Discharge: 2023-06-15 | Disposition: A | Discharge status: Home / Self Care | Payer: Self-pay | Source: Ambulatory Visit | Attending: Radiation Oncology | Admitting: Radiation Oncology

## 2023-06-15 ENCOUNTER — Encounter: Payer: Self-pay | Admitting: Internal Medicine

## 2023-06-15 ENCOUNTER — Encounter: Payer: Self-pay | Admitting: Radiation Oncology

## 2023-06-15 ENCOUNTER — Telehealth: Payer: Self-pay | Admitting: Physician Assistant

## 2023-06-15 VITALS — BP 117/67 | HR 72 | Temp 97.7°F | Resp 18 | Ht 63.0 in | Wt 164.2 lb

## 2023-06-15 DIAGNOSIS — S2221XA Fracture of manubrium, initial encounter for closed fracture: Secondary | ICD-10-CM | POA: Insufficient documentation

## 2023-06-15 DIAGNOSIS — Z923 Personal history of irradiation: Secondary | ICD-10-CM | POA: Insufficient documentation

## 2023-06-15 DIAGNOSIS — I700 Atherosclerosis of aorta: Secondary | ICD-10-CM | POA: Insufficient documentation

## 2023-06-15 DIAGNOSIS — Z9221 Personal history of antineoplastic chemotherapy: Secondary | ICD-10-CM | POA: Insufficient documentation

## 2023-06-15 DIAGNOSIS — C3412 Malignant neoplasm of upper lobe, left bronchus or lung: Secondary | ICD-10-CM | POA: Insufficient documentation

## 2023-06-15 DIAGNOSIS — I2510 Atherosclerotic heart disease of native coronary artery without angina pectoris: Secondary | ICD-10-CM | POA: Insufficient documentation

## 2023-06-15 HISTORY — DX: Personal history of irradiation: Z92.3

## 2023-06-15 NOTE — Progress Notes (Signed)
 Debra Lutz is here today for follow up post radiation to the lung.  Lung Side: Left. Patient completed treatment on 05/14/23  Does the patient complain of any of the following: Pain:No Shortness of breath w/wo exertion: No Cough: Yes- productive Hemoptysis: No Pain with swallowing: No Swallowing/choking concerns: No Appetite: Good Weight:  Wt Readings from Last 3 Encounters:  06/15/23 164 lb 4 oz (74.5 kg)  06/10/23 167 lb (75.8 kg)  05/12/23 168 lb 4.8 oz (76.3 kg)   Energy Level: Decent  Post radiation skin Changes: No    Additional comments if applicable:  BP 117/67 (BP Location: Left Arm, Patient Position: Sitting)   Pulse 72   Temp 97.7 F (36.5 C) (Temporal)   Resp 18   Ht 5\' 3"  (1.6 m)   Wt 164 lb 4 oz (74.5 kg)   SpO2 99%   BMI 29.10 kg/m

## 2023-06-15 NOTE — Telephone Encounter (Signed)
 I attempted to call the patient let her know that she does not need to be seen for her appointment this Wednesday on 06/17/2023.  Therefore we will adjust her lab appointment time to be closer to her infusion time.  I will also send her a MyChart message with this information.

## 2023-06-15 NOTE — Progress Notes (Signed)
 [facility] Clinical Social Work  Initial Assessment   Debra Lutz is a 63 y.o. year old female contacted by phone. Clinical Social Work was referred by medical provider for assessment of psychosocial needs.   SDOH (Social Determinants of Health) assessments performed: Yes SDOH Interventions    Flowsheet Row Clinical Support from 04/08/2023 in [facility] Dept Of [place]  SDOH Interventions   Financial Strain Interventions Other (Comment)  [pt applying for Medicaid and will be referred to financial resource specialist if approved for food stamps]       SDOH Screenings   Food Insecurity: No Food Insecurity (03/18/2023)  Housing: Low Risk  (03/18/2023)  Transportation Needs: No Transportation Needs (03/18/2023)  Utilities: Not At Risk (03/18/2023)  Depression (PHQ2-9): Low Risk  (03/18/2023)  Tobacco Use: High Risk (04/07/2023)     Distress Screen completed: No     No data to display            Family/Social Information:  Housing Arrangement: patient lives with her husband and their 50 y/o granddaughter. Family members/support persons in your life? Pt has some family, but support is limited. Transportation concerns: no  Employment: Unemployed pt applied for early retirement and started receiving social security the second week of January.  Pt's spouse works on a farm.  Pt does not have insurance and her application for Medicaid was denied.  Pt unsure why the application was denied, but her social security income combined w/ her husband's income may have put her over the income limit   Income source: Actor concerns: Yes, current concerns Type of concern: Medical bills Food access concerns: yes, pt given a food bag and aware she can get a food bag once a week while in treatment. Religious or spiritual practice: Yes-  Advanced directives: No Services Currently in place:  none  Coping/ Adjustment to  diagnosis: Patient understands treatment plan and what happens next? yes Concerns about diagnosis and/or treatment: Overwhelmed by information, Afraid of cancer, How I will pay for the services I need, and Quality of life Patient reported stressors: Insurance, Finances, Anxiety/ nervousness, and Adjusting to my illness Hopes and/or priorities: pt's priority is to start treatment w/ the hope of positive results Patient enjoys  not addressed Current coping skills/ strengths: Motivation for treatment/growth  and Physical Health     SUMMARY: Current SDOH Barriers:  Financial constraints related to limited income and lack of insurance  Clinical Social Work Clinical Goal(s):  Explore community resource options for unmet needs related to:  Financial Strain   Interventions: Discussed common feeling and emotions when being diagnosed with cancer, and the importance of support during treatment Informed patient of the support team roles and support services at [facility] Provided CSW contact information and encouraged patient to call with any questions or concerns Provided patient with information about the [employer] to wps resources options.  Informed pt of criteria to qualify for the [employer].  Pt to apply for food stamps.     Follow Up Plan: Patient will contact CSW with any support or resource needs Patient verbalizes understanding of plan: Yes    Devere JONELLE Manna, LCSW Clinical Social Worker [facility]

## 2023-06-16 ENCOUNTER — Telehealth: Payer: Self-pay

## 2023-06-16 NOTE — Telephone Encounter (Signed)
Tried to reach patient in regards to lab appt change. LVM of new lab appt time of 215 instead of 230. LVM with daughter, Lennox Laity as well.

## 2023-06-17 ENCOUNTER — Inpatient Hospital Stay: Payer: Self-pay

## 2023-06-17 ENCOUNTER — Ambulatory Visit: Payer: Self-pay | Admitting: Physician Assistant

## 2023-06-17 VITALS — BP 120/75 | HR 72 | Temp 98.0°F | Resp 18

## 2023-06-17 DIAGNOSIS — C3412 Malignant neoplasm of upper lobe, left bronchus or lung: Secondary | ICD-10-CM

## 2023-06-17 LAB — CBC WITH DIFFERENTIAL (CANCER CENTER ONLY)
Abs Immature Granulocytes: 0.02 10*3/uL (ref 0.00–0.07)
Basophils Absolute: 0 10*3/uL (ref 0.0–0.1)
Basophils Relative: 1 %
Eosinophils Absolute: 0.1 10*3/uL (ref 0.0–0.5)
Eosinophils Relative: 3 %
HCT: 36.2 % (ref 36.0–46.0)
Hemoglobin: 12.1 g/dL (ref 12.0–15.0)
Immature Granulocytes: 0 %
Lymphocytes Relative: 15 %
Lymphs Abs: 0.7 10*3/uL (ref 0.7–4.0)
MCH: 31.9 pg (ref 26.0–34.0)
MCHC: 33.4 g/dL (ref 30.0–36.0)
MCV: 95.5 fL (ref 80.0–100.0)
Monocytes Absolute: 0.4 10*3/uL (ref 0.1–1.0)
Monocytes Relative: 9 %
Neutro Abs: 3.2 10*3/uL (ref 1.7–7.7)
Neutrophils Relative %: 72 %
Platelet Count: 281 10*3/uL (ref 150–400)
RBC: 3.79 MIL/uL — ABNORMAL LOW (ref 3.87–5.11)
RDW: 15.2 % (ref 11.5–15.5)
WBC Count: 4.6 10*3/uL (ref 4.0–10.5)
nRBC: 0 % (ref 0.0–0.2)

## 2023-06-17 LAB — CMP (CANCER CENTER ONLY)
ALT: 13 U/L (ref 0–44)
AST: 14 U/L — ABNORMAL LOW (ref 15–41)
Albumin: 4.4 g/dL (ref 3.5–5.0)
Alkaline Phosphatase: 85 U/L (ref 38–126)
Anion gap: 6 (ref 5–15)
BUN: 12 mg/dL (ref 8–23)
CO2: 28 mmol/L (ref 22–32)
Calcium: 9.6 mg/dL (ref 8.9–10.3)
Chloride: 104 mmol/L (ref 98–111)
Creatinine: 0.72 mg/dL (ref 0.44–1.00)
GFR, Estimated: >60 mL/min (ref >60)
Glucose, Bld: 102 mg/dL — ABNORMAL HIGH (ref 70–99)
Potassium: 3.9 mmol/L (ref 3.5–5.1)
Sodium: 138 mmol/L (ref 135–145)
Total Bilirubin: 0.3 mg/dL (ref 0.0–1.2)
Total Protein: 7.2 g/dL (ref 6.5–8.1)

## 2023-06-17 LAB — TSH: TSH: 1.044 u[IU]/mL (ref 0.350–4.500)

## 2023-06-17 MED ORDER — SODIUM CHLORIDE 0.9 % IV SOLN: INTRAVENOUS | Status: DC

## 2023-06-17 MED ORDER — SODIUM CHLORIDE 0.9 % IV SOLN
1500.0000 mg | Freq: Once | INTRAVENOUS | Status: AC
  Administered 2023-06-17: 1500 mg via INTRAVENOUS
  Filled 2023-06-17: qty 30

## 2023-06-17 NOTE — Progress Notes (Signed)
Confirmed Imfinzi 1500 mg Q 4 weeks is the MD planned dose and frequency.  Anola Gurney [place], [place], BCPS, BCOP 06/17/2023 3:04 PM

## 2023-06-17 NOTE — Patient Instructions (Signed)

## 2023-06-19 LAB — T4: T4, Total: 10.4 ug/dL (ref 4.5–12.0)

## 2023-06-24 ENCOUNTER — Encounter: Payer: Self-pay | Admitting: Internal Medicine

## 2023-07-01 NOTE — Progress Notes (Unsigned)
 Chief Complaint:Cecal mass seen on imaging studies. Patient never had a colonoscopy  Primary GI Doctor:unassigned  HPI:  Patient is a  63  year old female/female patient with past medical history of *****who was referred to me by Si Gaul, MD on 03/20/23 for cecal mass seen on imaging studies. Patient never had a colonoscopy  .    Interval History  Patient admits/denies GERD Patient admits/denies dysphagia Patient admits/denies nausea, vomiting, or weight loss  Patient admits/denies altered bowel habits Patient admits/denies abdominal pain Patient admits/denies rectal bleeding   Denies/Admits alcohol Denies/Admits smoking Denies/Admits NSAID use. Denies/Admits they are on blood thinners.  Patients last colonoscopy Patients last EGD  Patient's family history includes  Wt Readings from Last 3 Encounters:  06/15/23 164 lb 4 oz (74.5 kg)  06/10/23 167 lb (75.8 kg)  05/12/23 168 lb 4.8 oz (76.3 kg)      Past Medical History:  Diagnosis Date   COPD (chronic obstructive pulmonary disease) (HCC)    History of radiation therapy    Left Lung SBRT- 03/30/23-05/14/23- Dr. Antony Blackbird   Lung nodule 09/2022   hypermetabolic left upper lobe    Past Surgical History:  Procedure Laterality Date   BRONCHIAL NEEDLE ASPIRATION BIOPSY  03/02/2023   Procedure: BRONCHIAL NEEDLE ASPIRATION BIOPSIES;  Surgeon: Josephine Igo, DO;  Location: [facility];  Service: Pulmonary;;   VIDEO BRONCHOSCOPY WITH ENDOBRONCHIAL ULTRASOUND Left 03/02/2023   Procedure: VIDEO BRONCHOSCOPY WITH ENDOBRONCHIAL ULTRASOUND;  Surgeon: Josephine Igo, DO;  Location: [facility];  Service: Pulmonary;  Laterality: Left;    Current Outpatient Medications  Medication Sig Dispense Refill   albuterol (ACCUNEB) 0.63 MG/3ML nebulizer solution Take 1 ampule by nebulization every 6 (six) hours as needed for wheezing.     albuterol (VENTOLIN HFA) 108 (90 Base) MCG/ACT inhaler Inhale into the lungs every 6  (six) hours as needed for wheezing or shortness of breath. Using at night     fexofenadine (ALLEGRA ALLERGY) 180 MG tablet Take 180 mg by mouth as needed.     ibuprofen (ADVIL) 200 MG tablet Take 200 mg by mouth every 4 (four) hours as needed for mild pain (pain score 1-3).     Multiple Vitamins-Minerals (MULTIVITAMINS THER. W/MINERALS) TABS tablet Take 1 tablet by mouth daily.     sucralfate (CARAFATE) 1 g tablet Take 1 tablet (1 g total) by mouth 4 (four) times daily -  with meals and at bedtime. Crush and dissolve in 10 mL of warm water prior to swallowing.  Take 20 to 30 minutes before meals (Patient not taking: Reported on 06/15/2023) 120 tablet 1   No current facility-administered medications for this visit.    Allergies as of 07/02/2023 - Review Complete 06/15/2023  Allergen Reaction Noted   Wixela inhub [fluticasone-salmeterol] Other (See Comments) 01/15/2023    Family History  Problem Relation Age of Onset   Bladder Cancer Father    Cancer Paternal Uncle     Review of Systems:    Constitutional: No weight loss, fever, chills, weakness or fatigue HEENT: Eyes: No change in vision               Ears, Nose, Throat:  No change in hearing or congestion Skin: No rash or itching Cardiovascular: No chest pain, chest pressure or palpitations   Respiratory: No SOB or cough Gastrointestinal: See HPI and otherwise negative Genitourinary: No dysuria or change in urinary frequency Neurological: No headache, dizziness or syncope Musculoskeletal: No new muscle or joint pain Hematologic:  No bleeding or bruising Psychiatric: No history of depression or anxiety    Physical Exam:  Vital signs: There were no vitals taken for this visit.  Constitutional:   Pleasant Caucasian female*** appears to be in NAD, Well developed, Well nourished, alert and cooperative Head:  Normocephalic and atraumatic. Eyes:   PEERL, EOMI. No icterus. Conjunctiva pink. Ears:  Normal auditory acuity. Neck:   Supple Throat: Oral cavity and pharynx without inflammation, swelling or lesion.  Respiratory: Respirations even and unlabored. Lungs clear to auscultation bilaterally.   No wheezes, crackles, or rhonchi.  Cardiovascular: Normal S1, S2. Regular rate and rhythm. No peripheral edema, cyanosis or pallor.  Gastrointestinal:  Soft, nondistended, nontender. No rebound or guarding. Normal bowel sounds. No appreciable masses or hepatomegaly. Rectal:  Not performed.  Anoscopy: Msk:  Symmetrical without gross deformities. Without edema, no deformity or joint abnormality.  Neurologic:  Alert and  oriented x4;  grossly normal neurologically.  Skin:   Dry and intact without significant lesions or rashes. Psychiatric: Oriented to person, place and time. Demonstrates good judgement and reason without abnormal affect or behaviors.  RELEVANT LABS AND IMAGING: CBC    Latest Ref Rng & Units 06/17/2023    2:01 PM 06/10/2023    9:02 AM 05/12/2023    7:52 AM  CBC  WBC 4.0 - 10.5 K/uL 4.6  5.1  1.5   Hemoglobin 12.0 - 15.0 g/dL 16.1  09.6  04.5   Hematocrit 36.0 - 46.0 % 36.2  35.7  31.1   Platelets 150 - 400 K/uL 281  237  108      CMP     Latest Ref Rng & Units 06/17/2023    2:01 PM 06/10/2023    9:02 AM 05/12/2023    7:52 AM  CMP  Glucose 70 - 99 mg/dL 409  811  97   BUN 8 - 23 mg/dL 12  12  8    Creatinine 0.44 - 1.00 mg/dL 9.14  7.82  9.56   Sodium 135 - 145 mmol/L 138  139  140   Potassium 3.5 - 5.1 mmol/L 3.9  3.9  3.3   Chloride 98 - 111 mmol/L 104  102  104   CO2 22 - 32 mmol/L 28  31  30    Calcium 8.9 - 10.3 mg/dL 9.6  9.7  9.2   Total Protein 6.5 - 8.1 g/dL 7.2  7.3  6.9   Total Bilirubin 0.0 - 1.2 mg/dL 0.3  0.5  0.5   Alkaline Phos 38 - 126 U/L 85  84  67   AST 15 - 41 U/L 14  17  12    ALT 0 - 44 U/L 13  18  14       Lab Results  Component Value Date   TSH 1.044 06/17/2023    06/10/23 CT Chest W contrast IMPRESSION: 1. Post treatment changes within the left upper lobe with new bandlike  area of consolidation within the anteromedial left upper lobe. The underlying treated lung lesion with spiculated margins measures 2.3 x 1.8 cm. On the previous exam this measured 3.6 x 2.6 cm. 2. No signs of mediastinal or hilar adenopathy. Index tracer avid AP window lymph node has decreased in size from previous exam. 3. Healing fracture of the sternal manubrium. 4. Coronary artery calcifications. 5.  Aortic Atherosclerosis (ICD10-I70.0). 03/02/23 CT Super D Chest WO Contrast IMPRESSION: 1. Imaging for bronchoscopy planning and guidance. 2. Slight interval enlargement of the dominant spiculated left upper lobe mass, consistent  with primary bronchogenic carcinoma. 3. Grossly stable small AP window and hilar lymph nodes, hypermetabolic on PET-CT, suspicious for metastatic disease. 4. No other evidence of metastatic disease. 5. Subacute appearing fracture of the sternal manubrium, grossly unchanged from recent PET-CT. 6.  Aortic Atherosclerosis (ICD10-I70.0). 02/05/2023 NM PET IMPRESSION: 1. Hypermetabolic left upper lobe pulmonary mass measuring 3.1 cm, highly suspicious for primary bronchogenic carcinoma. 2. Hypermetabolic left hilar and AP window lymph nodes, compatible with nodal metastatic disease. 3. Focal hypermetabolic activity in the cecum, nonspecific possibly physiologic, consider correlation with colonoscopy. 4.  Aortic Atherosclerosis (ICD10-I70.0).   Assessment: 1. ***  Plan: - Schedule a colonoscopy. The risks and benefits of colonoscopy with possible polypectomy / biopsies were discussed and the patient agrees to proceed.      Thank you for the courtesy of this consult. Please call me with any questions or concerns.   Coron Rossano, FNP-C [facility] 07/01/2023, 10:07 AM  Cc: Si Gaul, MD

## 2023-07-02 ENCOUNTER — Ambulatory Visit (INDEPENDENT_AMBULATORY_CARE_PROVIDER_SITE_OTHER): Payer: Self-pay | Admitting: Gastroenterology

## 2023-07-02 ENCOUNTER — Encounter: Payer: Self-pay | Admitting: Gastroenterology

## 2023-07-02 VITALS — BP 110/70 | HR 90 | Ht 63.0 in | Wt 164.0 lb

## 2023-07-02 DIAGNOSIS — R948 Abnormal results of function studies of other organs and systems: Secondary | ICD-10-CM

## 2023-07-02 DIAGNOSIS — C3412 Malignant neoplasm of upper lobe, left bronchus or lung: Secondary | ICD-10-CM

## 2023-07-02 MED ORDER — SUFLAVE 178.7 G PO SOLR: 1.0000 | Freq: Once | ORAL | 0 refills | Status: AC

## 2023-07-02 NOTE — Patient Instructions (Signed)
 You have been scheduled for a colonoscopy. Please follow written instructions given to you at your visit today.   If you use inhalers (even only as needed), please bring them with you on the day of your procedure.  DO NOT TAKE 7 DAYS PRIOR TO TEST- Trulicity (dulaglutide) Ozempic, Wegovy (semaglutide) Mounjaro (tirzepatide) Bydureon Bcise (exanatide extended release)  DO NOT TAKE 1 DAY PRIOR TO YOUR TEST Rybelsus (semaglutide) Adlyxin (lixisenatide) Victoza (liraglutide) Byetta (exanatide) ___________________________________________________________________________  Bonita Quin will receive your bowel preparation through [facility], which ensures the lowest copay and home delivery, with outreach via text or call from an [phone] number. Please respond promptly to avoid rescheduling of your procedure. If you are interested in alternative options or have any questions regarding your prep, please contact them at [phone] ____________________________________________________________________________  Your Provider Has Sent Your Bowel Prep Regimen To [facility]   [facility] will contact you to verify your information and collect your copay, if applicable. Enjoy the comfort of your home while your prescription is mailed to you, FREE of any shipping charges.   [facility] accepts all major insurance benefits and applies discounts & coupons.  Have additional questions?   Chat: [email or web address] Call: [phone] Email: [email or web address] NCPDP: [id number]  How will [facility] contact you?  With a Welcome phone call,  a Welcome text and a checkout link in text form.  Texts you receive from [phone] Are NOT Spam.  *To set up delivery, you must complete the checkout process via link or speak to one of the patient care representatives. If [facility] is unable to reach you, your prescription may be delayed.  To avoid long hold times on the phone, you may also utilize the secure chat  feature on the [facility] website to request that they call you back for transaction completion or to expedite your concerns. _______________________________________________________  If your blood pressure at your visit was 140/90 or greater, please contact your primary care physician to follow up on this.  _______________________________________________________  If you are age 63 or older, your body mass index should be between 23-30. Your Body mass index is 29.05 kg/m. If this is out of the aforementioned range listed, please consider follow up with your Primary Care Provider.  If you are age 54 or younger, your body mass index should be between 19-25. Your Body mass index is 29.05 kg/m. If this is out of the aformentioned range listed, please consider follow up with your Primary Care Provider.   ________________________________________________________  The [facility] providers would like to encourage you to use [facility] to communicate with providers for non-urgent requests or questions.  Due to long hold times on the telephone, sending your provider a message by [facility] may be a faster and more efficient way to get a response.  Please allow 48 business hours for a response.  Please remember that this is for non-urgent requests.  _______________________________________________________

## 2023-07-06 ENCOUNTER — Other Ambulatory Visit: Payer: Self-pay

## 2023-07-10 NOTE — Progress Notes (Signed)
 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   PRIOR THERAPY: Concurrent chemoradiation with carboplatin for AUC of 2 and paclitaxel 45 mg/m2 chemotherapy. Status post 6 cycles. First dose on 03/30/23. Status post 6  cycles.  Last dose was given May 05, 2023.   CURRENT THERAPY: Consolidation treatment with immunotherapy with Imfinzi 1500 Mg IV every 4 weeks. First dose June 17, 2023. Status post 1 cycle.   INTERVAL HISTORY: Debra Lutz 63 y.o. female returns to the clinic today for a follow-up.  She was last seen in the clinic 1 month ago by Dr. Arbutus Ped.  At that point time she had completed her course of concurrent chemoradiation and Dr. Arbutus Ped recommended consolidation immunotherapy with Imfinzi.  She started this last month and tolerated well without any adverse side effects.  Today she denies any fever, chills, night sweats, or unexplained weight loss. She has been struggling with a mild cough and some nasal congestion for a few weeks, even prior to starting immunotherapy. She is trying to take mucinex. She denies associated fevers, chills, or shortness of breath. The cough is worse at night. She has phlegm production. She is trying to quit smoking but has been unsuccessful. She also takes Careers adviser. She denies any significant dyspnea on exertion.  Denies any chest pain or hemoptysis.  Denies any nausea, vomiting, diarrhea, or constipation.  Denies any rashes or skin changes. For the cecal abnormality seen on PET scan, she is scheduled for colonoscopy by Dr. Lavon Paganini on 07/31/23 She is here today for evaluation repeat blood work before undergoing cycle #2.  MEDICAL HISTORY: Past Medical History:  Diagnosis  Date   COPD (chronic obstructive pulmonary disease) (HCC)    History of radiation therapy    Left Lung SBRT- 03/30/23-05/14/23- Dr. Antony Blackbird   Lung nodule 09/2022   hypermetabolic left upper lobe    ALLERGIES:  is allergic to wixela inhub [fluticasone-salmeterol].  MEDICATIONS:  Current Outpatient Medications  Medication Sig Dispense Refill   albuterol (ACCUNEB) 0.63 MG/3ML nebulizer solution Take 1 ampule by nebulization every 6 (six) hours as needed for wheezing.     albuterol (VENTOLIN HFA) 108 (90 Base) MCG/ACT inhaler Inhale into the lungs every 6 (six) hours as needed for wheezing or shortness of breath. Using at night     fexofenadine (ALLEGRA ALLERGY) 180 MG tablet Take 180 mg by mouth as needed.     ibuprofen (ADVIL) 200 MG tablet Take 200 mg by mouth every 4 (four) hours as needed for mild pain (pain score 1-3).     Multiple Vitamins-Minerals (MULTIVITAMINS THER. W/MINERALS) TABS tablet Take 1 tablet by mouth daily.     No current facility-administered medications for this visit.    SURGICAL HISTORY:  Past Surgical History:  Procedure Laterality Date   BRONCHIAL NEEDLE ASPIRATION BIOPSY  03/02/2023   Procedure: BRONCHIAL NEEDLE ASPIRATION BIOPSIES;  Surgeon: Josephine Igo, DO;  Location: [facility];  Service: Pulmonary;;   VIDEO BRONCHOSCOPY WITH ENDOBRONCHIAL ULTRASOUND Left 03/02/2023   Procedure: VIDEO BRONCHOSCOPY WITH ENDOBRONCHIAL ULTRASOUND;  Surgeon: Josephine Igo, DO;  Location: [facility];  Service: Pulmonary;  Laterality: Left;    REVIEW OF SYSTEMS:   Review of Systems  Constitutional: Positive for stable fatigue. Negative for  appetite change, chills,  fever and unexpected weight change.  HENT: Positive for nasal congestion. Negative for mouth sores, nosebleeds, sore throat and trouble swallowing.   Eyes: Negative for eye problems and icterus.  Respiratory: Positive for stable cough. Negative for hemoptysis, shortness of breath and wheezing.    Cardiovascular: Negative for chest pain and leg swelling.  Gastrointestinal: Negative for abdominal pain, constipation, diarrhea, nausea and vomiting.  Genitourinary: Negative for bladder incontinence, difficulty urinating, dysuria, frequency and hematuria.   Musculoskeletal: Negative for back pain, gait problem, neck pain and neck stiffness.  Skin: Negative for itching and rash.  Neurological: Negative for dizziness, extremity weakness, gait problem, headaches, light-headedness and seizures.  Hematological: Negative for adenopathy. Does not bruise/bleed easily.  Psychiatric/Behavioral: Negative for confusion, depression and sleep disturbance. The patient is not nervous/anxious.     PHYSICAL EXAMINATION:  There were no vitals taken for this visit.  ECOG PERFORMANCE STATUS: 1  Physical Exam  Constitutional: Oriented to person, place, and time and well-developed, well-nourished, and in no distress.  HENT:  Head: Normocephalic and atraumatic.  Mouth/Throat: Oropharynx is clear and moist. No oropharyngeal exudate.  Eyes: Conjunctivae are normal. Right eye exhibits no discharge. Left eye exhibits no discharge. No scleral icterus.  Neck: Normal range of motion. Neck supple.  Cardiovascular: Normal rate, regular rhythm, normal heart sounds and intact distal pulses.   Pulmonary/Chest: Effort normal. Some mild wheezing/rhonchi noted in the left lower lobe which improves with coughing. No respiratory distress. No rales.  Abdominal: Soft. Bowel sounds are normal. Exhibits no distension and no mass. There is no tenderness.  Musculoskeletal: Normal range of motion. Exhibits no edema.  Lymphadenopathy:    No cervical adenopathy.  Neurological: Alert and oriented to person, place, and time. Exhibits normal muscle tone. Gait normal. Coordination normal.  Skin: Skin is warm and dry. No rash noted. Not diaphoretic. No erythema. No pallor.  Psychiatric: Mood, memory and judgment normal.  Vitals  reviewed.  LABORATORY DATA: Lab Results  Component Value Date   WBC 4.6 06/17/2023   HGB 12.1 06/17/2023   HCT 36.2 06/17/2023   MCV 95.5 06/17/2023   PLT 281 06/17/2023      Chemistry      Component Value Date/Time   NA 138 06/17/2023 1401   K 3.9 06/17/2023 1401   CL 104 06/17/2023 1401   CO2 28 06/17/2023 1401   BUN 12 06/17/2023 1401   CREATININE 0.72 06/17/2023 1401      Component Value Date/Time   CALCIUM 9.6 06/17/2023 1401   ALKPHOS 85 06/17/2023 1401   AST 14 (L) 06/17/2023 1401   ALT 13 06/17/2023 1401   BILITOT 0.3 06/17/2023 1401       RADIOGRAPHIC STUDIES:  No results found.   ASSESSMENT/PLAN:  This is a very pleasant 63 year old Caucasian female diagnosed with stage IIIa (T2a, N2, M0) non-small cell lung cancer, with unknown subtype secondary to insufficient tissue presented with left upper lobe lung mass in addition to left hilar and AP window lymphadenopathy diagnosed in October 2024.   She underwent a course of concurrent chemoradiation with weekly carboplatin for AUC of 2 and paclitaxel 45 Mg/M2 status post 6 cycles of treatment.   He is currently undergoing consolidation immunotherapy with Imfinzi 1500 mg IV every 4 weeks.  She is status post her first cycle of treatment which was given on 06/17/23.   Labs were reviewed.  Recommend that she proceed with cycle #2 today scheduled.  We will see her back for follow-up  visit in 4 weeks for evaluation repeat blood work before undergoing cycle #3.  For the cecal abnormality seen on PET scan, she is scheduled for colonoscopy by Dr. Lavon Paganini on 07/31/23  She had a mild cough and congestion since prior to immunotherapy. She denies shortness of breath or fevers. She is well appearing and vitals normal. I sent her antibiotic for possible sinus infection. She will continue mucinex and robitussin for cough.   We also talked about smoking cessation and strategies for smoking cessation. I let the patient know I  believe that they have formal smoking cessation counseling appointments with pharmacy at the pulmonary practice and to reach ou to them to get more information   The patient was advised to call immediately if she has any concerning symptoms in the interval. The patient voices understanding of current disease status and treatment options and is in agreement with the current care plan. All questions were answered. The patient knows to call the clinic with any problems, questions or concerns. We can certainly see the patient much sooner if necessary     No orders of the defined types were placed in this encounter.   The total time spent in the appointment was 20-29 minutes  Geneveive Furness L Arena Lindahl, PA-C 07/10/23

## 2023-07-14 ENCOUNTER — Other Ambulatory Visit: Payer: Self-pay

## 2023-07-15 ENCOUNTER — Inpatient Hospital Stay: Payer: Self-pay

## 2023-07-15 ENCOUNTER — Inpatient Hospital Stay: Payer: Self-pay | Admitting: Physician Assistant

## 2023-07-15 ENCOUNTER — Inpatient Hospital Stay: Payer: Self-pay | Attending: Internal Medicine

## 2023-07-15 VITALS — BP 105/60 | HR 72 | Temp 97.6°F | Resp 17 | Ht 63.0 in | Wt 167.6 lb

## 2023-07-15 DIAGNOSIS — Z5112 Encounter for antineoplastic immunotherapy: Secondary | ICD-10-CM | POA: Insufficient documentation

## 2023-07-15 DIAGNOSIS — C3412 Malignant neoplasm of upper lobe, left bronchus or lung: Secondary | ICD-10-CM

## 2023-07-15 DIAGNOSIS — Z87891 Personal history of nicotine dependence: Secondary | ICD-10-CM | POA: Insufficient documentation

## 2023-07-15 LAB — CBC WITH DIFFERENTIAL (CANCER CENTER ONLY)
Abs Immature Granulocytes: 0.03 10*3/uL (ref 0.00–0.07)
Basophils Absolute: 0.1 10*3/uL (ref 0.0–0.1)
Basophils Relative: 1 %
Eosinophils Absolute: 0.2 10*3/uL (ref 0.0–0.5)
Eosinophils Relative: 4 %
HCT: 38.9 % (ref 36.0–46.0)
Hemoglobin: 13.2 g/dL (ref 12.0–15.0)
Immature Granulocytes: 1 %
Lymphocytes Relative: 17 %
Lymphs Abs: 0.7 10*3/uL (ref 0.7–4.0)
MCH: 32 pg (ref 26.0–34.0)
MCHC: 33.9 g/dL (ref 30.0–36.0)
MCV: 94.4 fL (ref 80.0–100.0)
Monocytes Absolute: 0.4 10*3/uL (ref 0.1–1.0)
Monocytes Relative: 9 %
Neutro Abs: 2.9 10*3/uL (ref 1.7–7.7)
Neutrophils Relative %: 68 %
Platelet Count: 210 10*3/uL (ref 150–400)
RBC: 4.12 MIL/uL (ref 3.87–5.11)
RDW: 12.7 % (ref 11.5–15.5)
WBC Count: 4.3 10*3/uL (ref 4.0–10.5)
nRBC: 0 % (ref 0.0–0.2)

## 2023-07-15 LAB — CMP (CANCER CENTER ONLY)
ALT: 17 U/L (ref 0–44)
AST: 15 U/L (ref 15–41)
Albumin: 4.3 g/dL (ref 3.5–5.0)
Alkaline Phosphatase: 86 U/L (ref 38–126)
Anion gap: 7 (ref 5–15)
BUN: 11 mg/dL (ref 8–23)
CO2: 30 mmol/L (ref 22–32)
Calcium: 9.2 mg/dL (ref 8.9–10.3)
Chloride: 104 mmol/L (ref 98–111)
Creatinine: 0.74 mg/dL (ref 0.44–1.00)
GFR, Estimated: >60 mL/min (ref >60)
Glucose, Bld: 97 mg/dL (ref 70–99)
Potassium: 3.5 mmol/L (ref 3.5–5.1)
Sodium: 141 mmol/L (ref 135–145)
Total Bilirubin: 0.4 mg/dL (ref 0.0–1.2)
Total Protein: 6.9 g/dL (ref 6.5–8.1)

## 2023-07-15 MED ORDER — SODIUM CHLORIDE 0.9 % IV SOLN
1500.0000 mg | Freq: Once | INTRAVENOUS | Status: AC
  Administered 2023-07-15: 1500 mg via INTRAVENOUS
  Filled 2023-07-15: qty 30

## 2023-07-15 MED ORDER — AZITHROMYCIN 250 MG PO TABS: ORAL_TABLET | ORAL | 0 refills | Status: DC

## 2023-07-15 MED ORDER — SODIUM CHLORIDE 0.9 % IV SOLN
INTRAVENOUS | Status: DC
Start: 2023-07-15 — End: 2023-07-15

## 2023-07-15 NOTE — Patient Instructions (Signed)
 [facility] - A DEPT OF [facility]  Discharge Instructions: Thank you for choosing [facility] to provide your oncology and hematology care.   If you have a lab appointment with the Cancer Center, please go directly to the Cancer Center and check in at the registration area.   Wear comfortable clothing and clothing appropriate for easy access to any Portacath or PICC line.   We strive to give you quality time with your provider. You may need to reschedule your appointment if you arrive late (15 or more minutes).  Arriving late affects you and other patients whose appointments are after yours.  Also, if you miss three or more appointments without notifying the office, you may be dismissed from the clinic at the provider's discretion.      For prescription refill requests, have your pharmacy contact our office and allow 72 hours for refills to be completed.    Today you received the following chemotherapy and/or immunotherapy agents :  durvalumab      To help prevent nausea and vomiting after your treatment, we encourage you to take your nausea medication as directed.  BELOW ARE SYMPTOMS THAT SHOULD BE REPORTED IMMEDIATELY: *FEVER GREATER THAN 100.4 F (38 C) OR HIGHER *CHILLS OR SWEATING *NAUSEA AND VOMITING THAT IS NOT CONTROLLED WITH YOUR NAUSEA MEDICATION *UNUSUAL SHORTNESS OF BREATH *UNUSUAL BRUISING OR BLEEDING *URINARY PROBLEMS (pain or burning when urinating, or frequent urination) *BOWEL PROBLEMS (unusual diarrhea, constipation, pain near the anus) TENDERNESS IN MOUTH AND THROAT WITH OR WITHOUT PRESENCE OF ULCERS (sore throat, sores in mouth, or a toothache) UNUSUAL RASH, SWELLING OR PAIN  UNUSUAL VAGINAL DISCHARGE OR ITCHING   Items with * indicate a potential emergency and should be followed up as soon as possible or go to the Emergency Department if any problems should occur.  Please show the CHEMOTHERAPY ALERT CARD or  IMMUNOTHERAPY ALERT CARD at check-in to the Emergency Department and triage nurse.  Should you have questions after your visit or need to cancel or reschedule your appointment, please contact [facility] - A DEPT OF [facility]  Dept: [phone]  and follow the prompts.  Office hours are 8:00 a.m. to 4:30 p.m. Monday - Friday. Please note that voicemails left after 4:00 p.m. may not be returned until the following business day.  We are closed weekends and major holidays. You have access to a nurse at all times for urgent questions. Please call the main number to the clinic Dept: [phone] and follow the prompts.   For any non-urgent questions, you may also contact your provider using MyChart. We now offer e-Visits for anyone 30 and older to request care online for non-urgent symptoms. For details visit [email or web address].   Also download the MyChart app! Go to the app store, search "MyChart", open the app, select [place], and log in with your MyChart username and password.

## 2023-07-31 ENCOUNTER — Ambulatory Visit: Payer: Self-pay | Admitting: Gastroenterology

## 2023-07-31 ENCOUNTER — Encounter: Payer: Self-pay | Admitting: Gastroenterology

## 2023-07-31 VITALS — BP 112/67 | HR 86 | Temp 98.8°F | Resp 18 | Ht 63.0 in | Wt 164.0 lb

## 2023-07-31 DIAGNOSIS — D125 Benign neoplasm of sigmoid colon: Secondary | ICD-10-CM

## 2023-07-31 DIAGNOSIS — K5730 Diverticulosis of large intestine without perforation or abscess without bleeding: Secondary | ICD-10-CM

## 2023-07-31 DIAGNOSIS — R948 Abnormal results of function studies of other organs and systems: Secondary | ICD-10-CM

## 2023-07-31 DIAGNOSIS — D122 Benign neoplasm of ascending colon: Secondary | ICD-10-CM

## 2023-07-31 DIAGNOSIS — K635 Polyp of colon: Secondary | ICD-10-CM

## 2023-07-31 DIAGNOSIS — D123 Benign neoplasm of transverse colon: Secondary | ICD-10-CM

## 2023-07-31 DIAGNOSIS — K648 Other hemorrhoids: Secondary | ICD-10-CM

## 2023-07-31 DIAGNOSIS — K644 Residual hemorrhoidal skin tags: Secondary | ICD-10-CM

## 2023-07-31 DIAGNOSIS — C3412 Malignant neoplasm of upper lobe, left bronchus or lung: Secondary | ICD-10-CM

## 2023-07-31 MED ORDER — SODIUM CHLORIDE 0.9 % IV SOLN: 500.0000 mL | INTRAVENOUS | Status: AC

## 2023-07-31 NOTE — Patient Instructions (Signed)
 YOU HAD AN ENDOSCOPIC PROCEDURE TODAY AT THE [facility]:   Refer to the procedure report that was given to you for any specific questions about what was found during the examination.  If the procedure report does not answer your questions, please call your gastroenterologist to clarify.  If you requested that your care partner not be given the details of your procedure findings, then the procedure report has been included in a sealed envelope for you to review at your convenience later.  YOU SHOULD EXPECT: Some feelings of bloating in the abdomen. Passage of more gas than usual.  Walking can help get rid of the air that was put into your GI tract during the procedure and reduce the bloating. If you had a lower endoscopy (such as a colonoscopy or flexible sigmoidoscopy) you may notice spotting of blood in your stool or on the toilet paper. If you underwent a bowel prep for your procedure, you may not have a normal bowel movement for a few days.  Please Note:  You might notice some irritation and congestion in your nose or some drainage.  This is from the oxygen used during your procedure.  There is no need for concern and it should clear up in a day or so.  SYMPTOMS TO REPORT IMMEDIATELY:  Following lower endoscopy (colonoscopy or flexible sigmoidoscopy):  Excessive amounts of blood in the stool  Significant tenderness or worsening of abdominal pains  Swelling of the abdomen that is new, acute  Fever of 100F or higher   For urgent or emergent issues, a gastroenterologist can be reached at any hour by calling [phone]. Do not use MyChart messaging for urgent concerns.    DIET:  We do recommend a small meal at first, but then you may proceed to your regular diet.  Drink plenty of fluids but you should avoid alcoholic beverages for 24 hours.  MEDICATIONS: Continue present medications.  FOLLOW UP: Await pathology results. Repeat colonoscopy in 1 year for surveillance.  Please  see handouts given to you by your recovery nurse: Polyps, Diverticulosis, Hemorrhoids.  Thank you for allowing [place] to provide for  your healthcare needs today.  ACTIVITY:  You should plan to take it easy for the rest of today and you should NOT DRIVE or use heavy machinery until tomorrow (because of the sedation medicines used during the test).    FOLLOW UP: Our staff will call the number listed on your records the next business day following your procedure.  We will call around 7:15- 8:00 am to check on you and address any questions or concerns that you may have regarding the information given to you following your procedure. If we do not reach you, we will leave a message.     If any biopsies were taken you will be contacted by phone or by letter within the next 1-3 weeks.  Please call us at [phone] if you have not heard about the biopsies in 3 weeks.    SIGNATURES/CONFIDENTIALITY: You and/or your care partner have signed paperwork which will be entered into your electronic medical record.  These signatures attest to the fact that that the information above on your After Visit Summary has been reviewed and is understood.  Full responsibility of the confidentiality of this discharge information lies with you and/or your care-partner.

## 2023-07-31 NOTE — Progress Notes (Signed)
VS by [facility]

## 2023-07-31 NOTE — Op Note (Signed)
 [facility] Patient Name: Debra Lutz Procedure Date: 07/31/2023 3:33 PM MRN: [id number] Endoscopist: Napoleon Form , MD, [id number] Age: 63 Referring MD:  Date of Birth: 06/20/1960 Gender: Female Account #: [id number] Procedure:                Colonoscopy Indications:              Abnormal PET scan of the GI tract Medicines:                Monitored Anesthesia Care Procedure:                Pre-Anesthesia Assessment:                           - Prior to the procedure, a History and Physical                            was performed, and patient medications and                            allergies were reviewed. The patient's tolerance of                            previous anesthesia was also reviewed. The risks                            and benefits of the procedure and the sedation                            options and risks were discussed with the patient.                            All questions were answered, and informed consent                            was obtained. Prior Anticoagulants: The patient has                            taken no anticoagulant or antiplatelet agents. ASA                            Grade Assessment: III - A patient with severe                            systemic disease. After reviewing the risks and                            benefits, the patient was deemed in satisfactory                            condition to undergo the procedure.                           After obtaining informed consent, the colonoscope  was passed under direct vision. Throughout the                            procedure, the patient's blood pressure, pulse, and                            oxygen saturations were monitored continuously. The                            PCF-HQ190L Colonoscope [id number] was introduced                            through the anus and advanced to the the cecum,                            identified by  appendiceal orifice and ileocecal                            valve. The colonoscopy was performed without                            difficulty. The patient tolerated the procedure                            well. The quality of the bowel preparation was                            adequate. The ileocecal valve, appendiceal orifice,                            and rectum were photographed. Scope In: 3:38:50 PM Scope Out: 3:56:43 PM Scope Withdrawal Time: 0 hours 15 minutes 58 seconds  Total Procedure Duration: 0 hours 17 minutes 53 seconds  Findings:                 The perianal and digital rectal examinations were                            normal.                           Nine sessile polyps were found in the sigmoid                            colon, transverse colon and ascending colon. The                            polyps were 4 to 20 mm in size. These polyps were                            removed with a cold snare. Resection and retrieval                            were complete.  A 12 mm polyp was found in the sigmoid colon. The                            polyp was semi-pedunculated. The polyp was removed                            with a hot snare. Resection and retrieval were                            complete.                           Scattered large-mouthed, medium-mouthed and                            small-mouthed diverticula were found in the sigmoid                            colon and descending colon.                           Non-bleeding external and internal hemorrhoids were                            found during retroflexion. The hemorrhoids were                            medium-sized. Complications:            No immediate complications. Estimated Blood Loss:     Estimated blood loss was minimal. Impression:               - Nine 4 to 20 mm polyps in the sigmoid colon, in                            the transverse colon and in the  ascending colon,                            removed with a cold snare. Resected and retrieved.                           - One 12 mm polyp in the sigmoid colon, removed                            with a hot snare. Resected and retrieved.                           - Diverticulosis in the sigmoid colon and in the                            descending colon.                           - Non-bleeding external and internal hemorrhoids. Recommendation:           - Patient  has a contact number available for                            emergencies. The signs and symptoms of potential                            delayed complications were discussed with the                            patient. Return to normal activities tomorrow.                            Written discharge instructions were provided to the                            patient.                           - Resume previous diet.                           - Continue present medications.                           - Await pathology results.                           - Repeat colonoscopy in 1 year for surveillance. Napoleon Form, MD 07/31/2023 4:05:25 PM This report has been signed electronically.

## 2023-07-31 NOTE — Progress Notes (Signed)
 [facility] History and Physical   Primary Care Physician:  Patient, No Pcp Per   Reason for Procedure:  Abnormal pet CT of colon  Plan:     colonoscopy with possible interventions as needed     HPI: Debra Lutz is a very pleasant 63 y.o. female here for colonoscopy for Abnormal pet CT of colon.   The risks and benefits as well as alternatives of endoscopic procedure(s) have been discussed and reviewed. All questions answered. The patient agrees to proceed.    Past Medical History:  Diagnosis Date   Allergy    Cancer (HCC) 02/2023   lung   COPD (chronic obstructive pulmonary disease) (HCC)    History of radiation therapy    Left Lung SBRT- 03/30/23-05/14/23- Dr. Antony Blackbird   Lung nodule 09/2022   hypermetabolic left upper lobe    Past Surgical History:  Procedure Laterality Date   BRONCHIAL NEEDLE ASPIRATION BIOPSY  03/02/2023   Procedure: BRONCHIAL NEEDLE ASPIRATION BIOPSIES;  Surgeon: Josephine Igo, DO;  Location: [facility];  Service: Pulmonary;;   KNEE SURGERY Left 2017   knee repair with hardware   VIDEO BRONCHOSCOPY WITH ENDOBRONCHIAL ULTRASOUND Left 03/02/2023   Procedure: VIDEO BRONCHOSCOPY WITH ENDOBRONCHIAL ULTRASOUND;  Surgeon: Josephine Igo, DO;  Location: [facility];  Service: Pulmonary;  Laterality: Left;    Prior to Admission medications   Medication Sig Start Date End Date Taking? Authorizing Provider  albuterol (VENTOLIN HFA) 108 (90 Base) MCG/ACT inhaler Inhale into the lungs every 6 (six) hours as needed for wheezing or shortness of breath. Using at night   Yes [provider]  Multiple Vitamins-Minerals (MULTIVITAMINS THER. W/MINERALS) TABS tablet Take 1 tablet by mouth daily.   Yes [provider]  albuterol (ACCUNEB) 0.63 MG/3ML nebulizer solution Take 1 ampule by nebulization every 6 (six) hours as needed for wheezing.    [provider]  azithromycin (ZITHROMAX Z-PAK) 250 MG tablet Take as  directed 07/15/23   Heilingoetter, Cassandra L, PA-C  fexofenadine (ALLEGRA ALLERGY) 180 MG tablet Take 180 mg by mouth as needed.    [provider]  ibuprofen (ADVIL) 200 MG tablet Take 200 mg by mouth every 4 (four) hours as needed for mild pain (pain score 1-3).    [provider]    Current Outpatient Medications  Medication Sig Dispense Refill   albuterol (VENTOLIN HFA) 108 (90 Base) MCG/ACT inhaler Inhale into the lungs every 6 (six) hours as needed for wheezing or shortness of breath. Using at night     Multiple Vitamins-Minerals (MULTIVITAMINS THER. W/MINERALS) TABS tablet Take 1 tablet by mouth daily.     albuterol (ACCUNEB) 0.63 MG/3ML nebulizer solution Take 1 ampule by nebulization every 6 (six) hours as needed for wheezing.     azithromycin (ZITHROMAX Z-PAK) 250 MG tablet Take as directed 6 each 0   fexofenadine (ALLEGRA ALLERGY) 180 MG tablet Take 180 mg by mouth as needed.     ibuprofen (ADVIL) 200 MG tablet Take 200 mg by mouth every 4 (four) hours as needed for mild pain (pain score 1-3).     Current Facility-Administered Medications  Medication Dose Route Frequency Provider Last Rate Last Admin   0.9 %  sodium chloride infusion  500 mL Intravenous Continuous Napoleon Form, MD        Allergies as of 07/31/2023 - Review Complete 07/31/2023  Allergen Reaction Noted   Wixela inhub [fluticasone-salmeterol] Other (See Comments) 01/15/2023    Family History  Problem Relation Age  of Onset   Bladder Cancer Father    Cancer Paternal Uncle    Colon cancer Neg Hx    Rectal cancer Neg Hx    Stomach cancer Neg Hx    Esophageal cancer Neg Hx     Social History   Socioeconomic History   Marital status: Married    Spouse name: Not on file   Number of children: Not on file   Years of education: Not on file   Highest education level: Not on file  Occupational History   Not on file  Tobacco Use   Smoking status: Every Day    Current packs/day: 1.00     Types: Cigarettes   Smokeless tobacco: Not on file  Vaping Use   Vaping status: Never Used  Substance and Sexual Activity   Alcohol use: No   Drug use: Not Currently    Types: Marijuana    Comment: instructed to withhold 1-2 days prior to procedure.   Sexual activity: Not on file  Other Topics Concern   Not on file  Social History Narrative   Not on file   Social Drivers of Health   Financial Resource Strain: Not on file  Food Insecurity: No Food Insecurity (03/18/2023)   Hunger Vital Sign    Worried About Running Out of Food in the Last Year: Never true    Ran Out of Food in the Last Year: Never true  Transportation Needs: No Transportation Needs (03/18/2023)   PRAPARE - Administrator, Civil Service (Medical): No    Lack of Transportation (Non-Medical): No  Physical Activity: Not on file  Stress: Not on file  Social Connections: Not on file  Intimate Partner Violence: Not At Risk (03/18/2023)   Humiliation, Afraid, Rape, and Kick questionnaire    Fear of Current or Ex-Partner: No    Emotionally Abused: No    Physically Abused: No    Sexually Abused: No    Review of Systems:  All other review of systems negative except as mentioned in the HPI.  Physical Exam: Vital signs in last 24 hours: BP 117/82   Pulse 89   Temp 98.8 F (37.1 C)   Ht 5\' 3"  (1.6 m)   Wt 164 lb (74.4 kg)   SpO2 95%   BMI 29.05 kg/m  General:   Alert, NAD Lungs:  Clear .   Heart:  Regular rate and rhythm Abdomen:  Soft, nontender and nondistended. Neuro/Psych:  Alert and cooperative. Normal mood and affect. A and O x 3  Reviewed labs, radiology imaging, old records and pertinent past GI work up  Patient is appropriate for planned procedure(s) and anesthesia in an ambulatory setting   K. Scherry Ran , MD [phone]

## 2023-07-31 NOTE — Progress Notes (Signed)
 A/o x 3, VSS, gd SR's, pleased with anesthesia, report to RN

## 2023-07-31 NOTE — Progress Notes (Signed)
 Called to room to assist during endoscopic procedure.  Patient ID and intended procedure confirmed with present staff. Received instructions for my participation in the procedure from the performing physician.

## 2023-08-03 ENCOUNTER — Telehealth: Payer: Self-pay

## 2023-08-03 NOTE — Telephone Encounter (Signed)
  Follow up Call-     07/31/2023    2:38 PM  Call back number  Post procedure Call Back phone  # [phone]  Permission to leave phone message Yes   Attempted to call patient regarding follow-up. No answer, VM left.

## 2023-08-04 ENCOUNTER — Other Ambulatory Visit: Payer: Self-pay

## 2023-08-05 LAB — SURGICAL PATHOLOGY

## 2023-08-12 ENCOUNTER — Inpatient Hospital Stay: Payer: Self-pay | Attending: Internal Medicine

## 2023-08-12 ENCOUNTER — Inpatient Hospital Stay: Payer: Self-pay

## 2023-08-12 ENCOUNTER — Inpatient Hospital Stay: Payer: Self-pay | Admitting: Internal Medicine

## 2023-08-12 VITALS — BP 114/73 | HR 80 | Temp 98.3°F | Resp 16 | Ht 63.0 in | Wt 167.5 lb

## 2023-08-12 DIAGNOSIS — F17210 Nicotine dependence, cigarettes, uncomplicated: Secondary | ICD-10-CM | POA: Insufficient documentation

## 2023-08-12 DIAGNOSIS — C3412 Malignant neoplasm of upper lobe, left bronchus or lung: Secondary | ICD-10-CM | POA: Insufficient documentation

## 2023-08-12 DIAGNOSIS — C3490 Malignant neoplasm of unspecified part of unspecified bronchus or lung: Secondary | ICD-10-CM

## 2023-08-12 DIAGNOSIS — Z79620 Long term (current) use of immunosuppressive biologic: Secondary | ICD-10-CM | POA: Insufficient documentation

## 2023-08-12 DIAGNOSIS — Z5112 Encounter for antineoplastic immunotherapy: Secondary | ICD-10-CM | POA: Insufficient documentation

## 2023-08-12 LAB — CMP (CANCER CENTER ONLY)
ALT: 12 U/L (ref 0–44)
AST: 12 U/L — ABNORMAL LOW (ref 15–41)
Albumin: 4.5 g/dL (ref 3.5–5.0)
Alkaline Phosphatase: 89 U/L (ref 38–126)
Anion gap: 7 (ref 5–15)
BUN: 10 mg/dL (ref 8–23)
CO2: 28 mmol/L (ref 22–32)
Calcium: 9.6 mg/dL (ref 8.9–10.3)
Chloride: 104 mmol/L (ref 98–111)
Creatinine: 0.73 mg/dL (ref 0.44–1.00)
GFR, Estimated: >60 mL/min (ref >60)
Glucose, Bld: 103 mg/dL — ABNORMAL HIGH (ref 70–99)
Potassium: 3.8 mmol/L (ref 3.5–5.1)
Sodium: 139 mmol/L (ref 135–145)
Total Bilirubin: 0.4 mg/dL (ref 0.0–1.2)
Total Protein: 7.6 g/dL (ref 6.5–8.1)

## 2023-08-12 LAB — CBC WITH DIFFERENTIAL (CANCER CENTER ONLY)
Abs Immature Granulocytes: 0.01 10*3/uL (ref 0.00–0.07)
Basophils Absolute: 0 10*3/uL (ref 0.0–0.1)
Basophils Relative: 1 %
Eosinophils Absolute: 0.1 10*3/uL (ref 0.0–0.5)
Eosinophils Relative: 2 %
HCT: 42 % (ref 36.0–46.0)
Hemoglobin: 14.2 g/dL (ref 12.0–15.0)
Immature Granulocytes: 0 %
Lymphocytes Relative: 16 %
Lymphs Abs: 0.7 10*3/uL (ref 0.7–4.0)
MCH: 31.1 pg (ref 26.0–34.0)
MCHC: 33.8 g/dL (ref 30.0–36.0)
MCV: 91.9 fL (ref 80.0–100.0)
Monocytes Absolute: 0.3 10*3/uL (ref 0.1–1.0)
Monocytes Relative: 8 %
Neutro Abs: 3.1 10*3/uL (ref 1.7–7.7)
Neutrophils Relative %: 73 %
Platelet Count: 200 10*3/uL (ref 150–400)
RBC: 4.57 MIL/uL (ref 3.87–5.11)
RDW: 12 % (ref 11.5–15.5)
WBC Count: 4.2 10*3/uL (ref 4.0–10.5)
nRBC: 0 % (ref 0.0–0.2)

## 2023-08-12 LAB — TSH: TSH: 1.183 u[IU]/mL (ref 0.350–4.500)

## 2023-08-12 MED ORDER — SODIUM CHLORIDE 0.9 % IV SOLN: INTRAVENOUS | Status: DC

## 2023-08-12 MED ORDER — SODIUM CHLORIDE 0.9 % IV SOLN
1500.0000 mg | Freq: Once | INTRAVENOUS | Status: AC
  Administered 2023-08-12: 1500 mg via INTRAVENOUS
  Filled 2023-08-12: qty 30

## 2023-08-12 NOTE — Patient Instructions (Signed)
 [facility] - A DEPT OF [facility]  Discharge Instructions: Thank you for choosing [facility] to provide your oncology and hematology care.   If you have a lab appointment with the Cancer Center, please go directly to the Cancer Center and check in at the registration area.   Wear comfortable clothing and clothing appropriate for easy access to any Portacath or PICC line.   We strive to give you quality time with your provider. You may need to reschedule your appointment if you arrive late (15 or more minutes).  Arriving late affects you and other patients whose appointments are after yours.  Also, if you miss three or more appointments without notifying the office, you may be dismissed from the clinic at the provider's discretion.      For prescription refill requests, have your pharmacy contact our office and allow 72 hours for refills to be completed.    Today you received the following chemotherapy and/or immunotherapy agents :  durvalumab      To help prevent nausea and vomiting after your treatment, we encourage you to take your nausea medication as directed.  BELOW ARE SYMPTOMS THAT SHOULD BE REPORTED IMMEDIATELY: *FEVER GREATER THAN 100.4 F (38 C) OR HIGHER *CHILLS OR SWEATING *NAUSEA AND VOMITING THAT IS NOT CONTROLLED WITH YOUR NAUSEA MEDICATION *UNUSUAL SHORTNESS OF BREATH *UNUSUAL BRUISING OR BLEEDING *URINARY PROBLEMS (pain or burning when urinating, or frequent urination) *BOWEL PROBLEMS (unusual diarrhea, constipation, pain near the anus) TENDERNESS IN MOUTH AND THROAT WITH OR WITHOUT PRESENCE OF ULCERS (sore throat, sores in mouth, or a toothache) UNUSUAL RASH, SWELLING OR PAIN  UNUSUAL VAGINAL DISCHARGE OR ITCHING   Items with * indicate a potential emergency and should be followed up as soon as possible or go to the Emergency Department if any problems should occur.  Please show the CHEMOTHERAPY ALERT CARD or  IMMUNOTHERAPY ALERT CARD at check-in to the Emergency Department and triage nurse.  Should you have questions after your visit or need to cancel or reschedule your appointment, please contact [facility] - A DEPT OF [facility]  Dept: [phone]  and follow the prompts.  Office hours are 8:00 a.m. to 4:30 p.m. Monday - Friday. Please note that voicemails left after 4:00 p.m. may not be returned until the following business day.  We are closed weekends and major holidays. You have access to a nurse at all times for urgent questions. Please call the main number to the clinic Dept: [phone] and follow the prompts.   For any non-urgent questions, you may also contact your provider using MyChart. We now offer e-Visits for anyone 30 and older to request care online for non-urgent symptoms. For details visit [email or web address].   Also download the MyChart app! Go to the app store, search "MyChart", open the app, select [place], and log in with your MyChart username and password.

## 2023-08-12 NOTE — Progress Notes (Signed)
 [facility] Telephone:([phone]) [phone]   [phone]  OFFICE PROGRESS NOTE  Patient, No Pcp Per No address on file  DIAGNOSIS: stage IIIa (T2a, N2, M0) non-small cell lung cancer, with unknown subtype secondary to insufficient tissue presented with left upper lobe lung mass in addition to left hilar and AP window lymphadenopathy diagnosed in October 2024.    PRIOR THERAPY: Concurrent chemoradiation with carboplatin for AUC of 2 and paclitaxel 45 mg/m2 chemotherapy. Status post 6 cycles. First dose on 03/30/23. Status post 6  cycles.  Last dose was given May 05, 2023.   CURRENT THERAPY: Consolidation treatment with immunotherapy with Imfinzi 1500 Mg IV every 4 weeks.  First dose June 17, 2023.  Status post 2 cycles.  INTERVAL HISTORY: Debra Lutz 63 y.o. female returns to the clinic today for follow-up visit.Discussed the use of AI scribe software for clinical note transcription with the patient, who gave verbal consent to proceed.  History of Present Illness   Debra Lutz is a 63 year old female with stage three non-small cell lung cancer who presents for evaluation before starting cycle number three of immunotherapy.  She was diagnosed with stage three non-small cell lung cancer in October 2024. She completed a course of concurrent chemoradiation with weekly carboplatin and paclitaxel and is currently undergoing consolidation treatment with durvalumab (Imfinzi). She has completed two cycles of immunotherapy so far.  She is tolerating the immunotherapy well without significant side effects. However, she has developed a cough, which she attributes to either her COPD or allergies, as a Z-Pak prescribed previously did not alleviate the symptoms. She has switched from Allegra to [place] D, which has provided some improvement. She has run out of albuterol for her nebulizer, which was initially prescribed by urgent care, but she still has her inhaler.  She recently  underwent a colonoscopy on July 31, 2023, where ten polyps were removed. There were no post-procedure complications such as bleeding. The pathology results indicated tubular adenomas without high-grade dysplasia or cancer.       MEDICAL HISTORY: Past Medical History:  Diagnosis Date   Allergy    Cancer (HCC) 02/2023   lung   COPD (chronic obstructive pulmonary disease) (HCC)    History of radiation therapy    Left Lung SBRT- 03/30/23-05/14/23- Dr. Antony Blackbird   Lung nodule 09/2022   hypermetabolic left upper lobe    ALLERGIES:  is allergic to wixela inhub [fluticasone-salmeterol].  MEDICATIONS:  Current Outpatient Medications  Medication Sig Dispense Refill   albuterol (ACCUNEB) 0.63 MG/3ML nebulizer solution Take 1 ampule by nebulization every 6 (six) hours as needed for wheezing.     albuterol (VENTOLIN HFA) 108 (90 Base) MCG/ACT inhaler Inhale into the lungs every 6 (six) hours as needed for wheezing or shortness of breath. Using at night     azithromycin (ZITHROMAX Z-PAK) 250 MG tablet Take as directed 6 each 0   fexofenadine (ALLEGRA ALLERGY) 180 MG tablet Take 180 mg by mouth as needed.     ibuprofen (ADVIL) 200 MG tablet Take 200 mg by mouth every 4 (four) hours as needed for mild pain (pain score 1-3).     Multiple Vitamins-Minerals (MULTIVITAMINS THER. W/MINERALS) TABS tablet Take 1 tablet by mouth daily.     No current facility-administered medications for this visit.    SURGICAL HISTORY:  Past Surgical History:  Procedure Laterality Date   BRONCHIAL NEEDLE ASPIRATION BIOPSY  03/02/2023   Procedure: BRONCHIAL NEEDLE ASPIRATION BIOPSIES;  Surgeon:  Josephine Igo, DO;  Location: [facility];  Service: Pulmonary;;   KNEE SURGERY Left 2017   knee repair with hardware   VIDEO BRONCHOSCOPY WITH ENDOBRONCHIAL ULTRASOUND Left 03/02/2023   Procedure: VIDEO BRONCHOSCOPY WITH ENDOBRONCHIAL ULTRASOUND;  Surgeon: Josephine Igo, DO;  Location: [facility];  Service:  Pulmonary;  Laterality: Left;    REVIEW OF SYSTEMS:  A comprehensive review of systems was negative except for: Respiratory: positive for cough and dyspnea on exertion   PHYSICAL EXAMINATION: General appearance: alert, cooperative, and no distress Head: Normocephalic, without obvious abnormality, atraumatic Neck: no adenopathy, no JVD, supple, symmetrical, trachea midline, and thyroid not enlarged, symmetric, no tenderness/mass/nodules Lymph nodes: Cervical, supraclavicular, and axillary nodes normal. Resp: clear to auscultation bilaterally Back: symmetric, no curvature. ROM normal. No CVA tenderness. Cardio: regular rate and rhythm, S1, S2 normal, no murmur, click, rub or gallop GI: soft, non-tender; bowel sounds normal; no masses,  no organomegaly Extremities: extremities normal, atraumatic, no cyanosis or edema  ECOG PERFORMANCE STATUS: 1 - Symptomatic but completely ambulatory  Blood pressure 114/73, pulse 80, temperature 98.3 F (36.8 C), temperature source Temporal, resp. rate 16, height 5\' 3"  (1.6 m), weight 167 lb 8 oz (76 kg), SpO2 98%.  LABORATORY DATA: Lab Results  Component Value Date   WBC 4.2 08/12/2023   HGB 14.2 08/12/2023   HCT 42.0 08/12/2023   MCV 91.9 08/12/2023   PLT 200 08/12/2023      Chemistry      Component Value Date/Time   NA 139 08/12/2023 0955   K 3.8 08/12/2023 0955   CL 104 08/12/2023 0955   CO2 28 08/12/2023 0955   BUN 10 08/12/2023 0955   CREATININE 0.73 08/12/2023 0955      Component Value Date/Time   CALCIUM 9.6 08/12/2023 0955   ALKPHOS 89 08/12/2023 0955   AST 12 (L) 08/12/2023 0955   ALT 12 08/12/2023 0955   BILITOT 0.4 08/12/2023 0955       RADIOGRAPHIC STUDIES: No results found.  ASSESSMENT AND PLAN: This is a very pleasant 63 years old white female with stage IIIa (T2a, N2, M0) non-small cell lung cancer, with unknown subtype secondary to insufficient tissue presented with left upper lobe lung mass in addition to left hilar  and AP window lymphadenopathy diagnosed in October 2024.  She underwent a course of concurrent chemoradiation with weekly carboplatin for AUC of 2 and paclitaxel 45 Mg/M2 status post 6 cycles of treatment.  The patient has been tolerating this treatment fairly well. She is currently on consolidation treatment with immunotherapy with Imfinzi 1500 Mg IV every 4 weeks status post 2 cycles and she is here today for cycle #3.    Stage III Non-Small Cell Lung Cancer Stage III non-small cell lung cancer diagnosed in October 2024. Currently undergoing consolidation treatment with durvalumab following concurrent chemoradiation with carboplatin and paclitaxel. She is tolerating durvalumab well with no significant side effects. - Proceed with cycle 3 of durvalumab. - Order imaging 7-10 days before the next visit to assess treatment efficacy.  Chronic Obstructive Pulmonary Disease (COPD) COPD with current symptoms of cough. Recent azithromycin treatment was ineffective, likely due to allergies rather than infection. Switched from [employer] to [employer] D, providing some relief. Out of albuterol for nebulizer, but still has an inhaler. - Advise her to contact urgent care for albuterol refill.  Colonic Polyps Colonoscopy on July 31, 2023, revealed 10 tubular adenomas with no high-grade dysplasia or cancer. No post-procedural complications.   The patient was  advised to call immediately if she has any concerning symptoms in the interval. The patient voices understanding of current disease status and treatment options and is in agreement with the current care plan.  All questions were answered. The patient knows to call the clinic with any problems, questions or concerns. We can certainly see the patient much sooner if necessary.  The total time spent in the appointment was 20 minutes.  Disclaimer: This note was dictated with voice recognition software. Similar sounding words can inadvertently be transcribed and  may not be corrected upon review.

## 2023-08-13 LAB — T4: T4, Total: 10.8 ug/dL (ref 4.5–12.0)

## 2023-08-14 ENCOUNTER — Other Ambulatory Visit: Payer: Self-pay

## 2023-08-31 ENCOUNTER — Ambulatory Visit (HOSPITAL_COMMUNITY)
Admission: RE | Admit: 2023-08-31 | Discharge: 2023-08-31 | Disposition: A | Discharge status: Home / Self Care | Payer: Self-pay | Source: Ambulatory Visit | Attending: Internal Medicine | Admitting: Internal Medicine

## 2023-08-31 DIAGNOSIS — C3490 Malignant neoplasm of unspecified part of unspecified bronchus or lung: Secondary | ICD-10-CM | POA: Insufficient documentation

## 2023-08-31 MED ORDER — IOHEXOL 300 MG/ML  SOLN
75.0000 mL | Freq: Once | INTRAMUSCULAR | Status: AC | PRN
  Administered 2023-08-31: 75 mL via INTRAVENOUS

## 2023-09-09 ENCOUNTER — Inpatient Hospital Stay: Payer: Self-pay

## 2023-09-09 ENCOUNTER — Inpatient Hospital Stay: Payer: Self-pay | Attending: Internal Medicine

## 2023-09-09 ENCOUNTER — Inpatient Hospital Stay (HOSPITAL_BASED_OUTPATIENT_CLINIC_OR_DEPARTMENT_OTHER): Payer: Self-pay | Admitting: Internal Medicine

## 2023-09-09 VITALS — BP 119/71 | HR 72 | Temp 98.2°F | Resp 17 | Ht 63.0 in | Wt 170.1 lb

## 2023-09-09 DIAGNOSIS — C3412 Malignant neoplasm of upper lobe, left bronchus or lung: Secondary | ICD-10-CM | POA: Insufficient documentation

## 2023-09-09 DIAGNOSIS — Z79899 Other long term (current) drug therapy: Secondary | ICD-10-CM | POA: Insufficient documentation

## 2023-09-09 DIAGNOSIS — Z5112 Encounter for antineoplastic immunotherapy: Secondary | ICD-10-CM | POA: Insufficient documentation

## 2023-09-09 DIAGNOSIS — F17210 Nicotine dependence, cigarettes, uncomplicated: Secondary | ICD-10-CM | POA: Insufficient documentation

## 2023-09-09 LAB — CBC WITH DIFFERENTIAL (CANCER CENTER ONLY)
Abs Immature Granulocytes: 0.01 10*3/uL (ref 0.00–0.07)
Basophils Absolute: 0 10*3/uL (ref 0.0–0.1)
Basophils Relative: 1 %
Eosinophils Absolute: 0.1 10*3/uL (ref 0.0–0.5)
Eosinophils Relative: 3 %
HCT: 40.4 % (ref 36.0–46.0)
Hemoglobin: 13.4 g/dL (ref 12.0–15.0)
Immature Granulocytes: 0 %
Lymphocytes Relative: 18 %
Lymphs Abs: 0.8 10*3/uL (ref 0.7–4.0)
MCH: 30 pg (ref 26.0–34.0)
MCHC: 33.2 g/dL (ref 30.0–36.0)
MCV: 90.6 fL (ref 80.0–100.0)
Monocytes Absolute: 0.4 10*3/uL (ref 0.1–1.0)
Monocytes Relative: 8 %
Neutro Abs: 3.3 10*3/uL (ref 1.7–7.7)
Neutrophils Relative %: 70 %
Platelet Count: 221 10*3/uL (ref 150–400)
RBC: 4.46 MIL/uL (ref 3.87–5.11)
RDW: 12.4 % (ref 11.5–15.5)
WBC Count: 4.7 10*3/uL (ref 4.0–10.5)
nRBC: 0 % (ref 0.0–0.2)

## 2023-09-09 LAB — CMP (CANCER CENTER ONLY)
ALT: 11 U/L (ref 0–44)
AST: 13 U/L — ABNORMAL LOW (ref 15–41)
Albumin: 4.2 g/dL (ref 3.5–5.0)
Alkaline Phosphatase: 83 U/L (ref 38–126)
Anion gap: 9 (ref 5–15)
BUN: 10 mg/dL (ref 8–23)
CO2: 28 mmol/L (ref 22–32)
Calcium: 9.1 mg/dL (ref 8.9–10.3)
Chloride: 105 mmol/L (ref 98–111)
Creatinine: 0.77 mg/dL (ref 0.44–1.00)
GFR, Estimated: >60 mL/min (ref >60)
Glucose, Bld: 107 mg/dL — ABNORMAL HIGH (ref 70–99)
Potassium: 3.5 mmol/L (ref 3.5–5.1)
Sodium: 142 mmol/L (ref 135–145)
Total Bilirubin: 0.4 mg/dL (ref 0.0–1.2)
Total Protein: 6.8 g/dL (ref 6.5–8.1)

## 2023-09-09 MED ORDER — HYDROCODONE BIT-HOMATROP MBR 5-1.5 MG/5ML PO SOLN
5.0000 mL | Freq: Four times a day (QID) | ORAL | 0 refills | Status: DC | PRN
Start: 2023-09-09 — End: 2024-02-24

## 2023-09-09 MED ORDER — SODIUM CHLORIDE 0.9 % IV SOLN: INTRAVENOUS | Status: DC

## 2023-09-09 MED ORDER — SODIUM CHLORIDE 0.9 % IV SOLN
1500.0000 mg | Freq: Once | INTRAVENOUS | Status: AC
  Administered 2023-09-09: 1500 mg via INTRAVENOUS
  Filled 2023-09-09: qty 30

## 2023-09-09 NOTE — Patient Instructions (Signed)

## 2023-09-09 NOTE — Progress Notes (Signed)
 [facility] [phone]   Fax:(336) [phone]  OFFICE PROGRESS NOTE  Debra Lutz, Debra Lutz Debra address on file  DIAGNOSIS: stage IIIa (T2a, N2, M0) non-small cell lung cancer, with unknown subtype secondary to insufficient tissue presented with left upper lobe lung mass in addition to left hilar and AP window lymphadenopathy diagnosed in October 2024.    PRIOR THERAPY: Concurrent chemoradiation with carboplatin  for AUC of 2 and paclitaxel  45 mg/m2 chemotherapy. Status post 6 cycles. First dose on 03/30/23. Status post 6  cycles.  Last dose was given May 05, 2023.   CURRENT THERAPY: Consolidation treatment with immunotherapy with Imfinzi  1500 Mg IV every 4 weeks.  First dose June 17, 2023.  Status post 3 cycles.  INTERVAL HISTORY: Debra Lutz 63 y.o. female returns to the clinic today for follow-up visit.  Discussed the use of AI scribe software for clinical note transcription with the Debra Lutz, who gave verbal consent to proceed.  History of Present Illness   She is a 63 year old female with stage 3A non-small cell lung cancer who presents for evaluation before starting cycle four of consolidation immunotherapy.  She was diagnosed with stage 3A non-small cell lung cancer in October 2024. She has completed a course of concurrent chemoradiation and is currently undergoing consolidation immunotherapy with Imfinzi  (durvalumab ) every four weeks. She has completed three cycles and is here for evaluation before starting the fourth cycle, including a repeat CT scan of the chest.  She has a persistent cough that is impacting her sleep. The cough is initially dry but becomes productive with clear sputum. She has taken approximately 20 bottles of cough medicine and visited Urgent Care, where she was prescribed albuterol, doxycycline, and amoxicillin after an x-ray was inconclusive for pneumonia versus cancer. Despite these treatments, the cough persists and is described  as 'terrible'.  Her recent CT scan showed a decrease in the size of the speculated mass in the medial left upper lobe and a further decrease in the left-sided mediastinal lymph node.  She also underwent a colonoscopy on July 31, 2023, during which polyps were removed, and she states that the procedure went well.       MEDICAL HISTORY: Past Medical History:  Diagnosis Date   Allergy    Cancer (HCC) 02/2023   lung   COPD (chronic obstructive pulmonary disease) (HCC)    History of radiation therapy    Left Lung SBRT- 03/30/23-05/14/23- Dr. Retta Caster   Lung nodule 09/2022   hypermetabolic left upper lobe    ALLERGIES:  is allergic to wixela inhub [fluticasone-salmeterol].  MEDICATIONS:  Current Outpatient Medications  Medication Sig Dispense Refill   albuterol (ACCUNEB) 0.63 MG/3ML nebulizer solution Take 1 ampule by nebulization every 6 (six) hours as needed for wheezing.     albuterol (VENTOLIN HFA) 108 (90 Base) MCG/ACT inhaler Inhale into the lungs every 6 (six) hours as needed for wheezing or shortness of breath. Using at night     azithromycin  (ZITHROMAX  Z-PAK) 250 MG tablet Take as directed 6 each 0   fexofenadine (ALLEGRA ALLERGY) 180 MG tablet Take 180 mg by mouth as needed.     ibuprofen (ADVIL) 200 MG tablet Take 200 mg by mouth every 4 (four) hours as needed for mild pain (pain score 1-3).     Multiple Vitamins-Minerals (MULTIVITAMINS THER. W/MINERALS) TABS tablet Take 1 tablet by mouth daily.     Debra current facility-administered medications for this visit.    SURGICAL  HISTORY:  Past Surgical History:  Procedure Laterality Date   BRONCHIAL NEEDLE ASPIRATION BIOPSY  03/02/2023   Procedure: BRONCHIAL NEEDLE ASPIRATION BIOPSIES;  Surgeon: Prudy Brownie, DO;  Location: [facility];  Service: Pulmonary;;   KNEE SURGERY Left 2017   knee repair with hardware   VIDEO BRONCHOSCOPY WITH ENDOBRONCHIAL ULTRASOUND Left 03/02/2023   Procedure: VIDEO BRONCHOSCOPY WITH  ENDOBRONCHIAL ULTRASOUND;  Surgeon: Prudy Brownie, DO;  Location: [facility];  Service: Pulmonary;  Laterality: Left;    REVIEW OF SYSTEMS:  Constitutional: negative Eyes: negative Ears, nose, mouth, throat, and face: negative Respiratory: positive for cough Cardiovascular: negative Gastrointestinal: negative Genitourinary:negative Integument/breast: negative Hematologic/lymphatic: negative Musculoskeletal:negative Neurological: negative Behavioral/Psych: negative Endocrine: negative Allergic/Immunologic: negative   PHYSICAL EXAMINATION: General appearance: alert, cooperative, and Debra distress Head: Normocephalic, without obvious abnormality, atraumatic Neck: Debra adenopathy, Debra JVD, supple, symmetrical, trachea midline, and thyroid  not enlarged, symmetric, Debra tenderness/mass/nodules Lymph nodes: Cervical, supraclavicular, and axillary nodes normal. Resp: clear to auscultation bilaterally Back: symmetric, Debra curvature. ROM normal. Debra CVA tenderness. Cardio: regular rate and rhythm, S1, S2 normal, Debra murmur, click, rub or gallop GI: soft, non-tender; bowel sounds normal; Debra masses,  Debra organomegaly Extremities: extremities normal, atraumatic, Debra cyanosis or edema Neurologic: Alert and oriented X 3, normal strength and tone. Normal symmetric reflexes. Normal coordination and gait  ECOG PERFORMANCE STATUS: 1 - Symptomatic but completely ambulatory  Blood pressure 119/71, pulse 72, temperature 98.2 F (36.8 C), temperature source Temporal, resp. rate 17, height 5\' 3"  (1.6 m), weight 170 lb 1.6 oz (77.2 kg), SpO2 98%.  LABORATORY DATA: Lab Results  Component Value Date   WBC 4.7 09/09/2023   HGB 13.4 09/09/2023   HCT 40.4 09/09/2023   MCV 90.6 09/09/2023   PLT 221 09/09/2023      Chemistry      Component Value Date/Time   NA 139 08/12/2023 0955   K 3.8 08/12/2023 0955   CL 104 08/12/2023 0955   CO2 28 08/12/2023 0955   BUN 10 08/12/2023 0955   CREATININE 0.73  08/12/2023 0955      Component Value Date/Time   CALCIUM 9.6 08/12/2023 0955   ALKPHOS 89 08/12/2023 0955   AST 12 (L) 08/12/2023 0955   ALT 12 08/12/2023 0955   BILITOT 0.4 08/12/2023 0955       RADIOGRAPHIC STUDIES: CT Chest W Contrast Result Date: 09/04/2023 CLINICAL DATA:  Staging non-small-cell lung cancer. * Tracking Code: BO * EXAM: CT CHEST WITH CONTRAST TECHNIQUE: Multidetector CT imaging of the chest was performed during intravenous contrast administration. RADIATION DOSE REDUCTION: This exam was performed according to the departmental dose-optimization program which includes automated exposure control, adjustment of the mA and/or kV according to Debra Lutz size and/or use of iterative reconstruction technique. CONTRAST:  75mL OMNIPAQUE  IOHEXOL  300 MG/ML  SOLN COMPARISON:  CT scan with contrast 06/10/2023 and older FINDINGS: Cardiovascular: Heart is nonenlarged. Coronary calcifications are seen. Small pericardial effusion but increased from prior. Thoracic aorta is normal course and caliber with partially calcified atherosclerotic plaque. Mediastinum/Nodes: Normal caliber thoracic esophagus. Preserved thyroid  gland. Debra specific abnormal lymph node enlargement identified in the axillary regions, right hilum or mediastinum. The previous AP window node which previously measured 7 mm in short axis, today is even smaller measuring 5 mm on series 2, image 47. Lungs/Pleura: Left upper lobe spiculated area anterior and medial is again identified but appears smaller. On prior study the area was measured at 2.3 x 1.8 cm and today tissue in this location on  image 44 series 6 measures 2.8 by 1.2 cm. The bandlike area extending to the pleura anteromedial is also decreasing. Soft tissue thickening at the left hilum is similar to decreased. Small area of ground-glass along the medial left upper lobe more caudal on image 68 is new. Is also some mild right basilar atelectasis. Debra pneumothorax or effusion. A few  areas of ground-glass right upper lobe are also seen such as image 33 and 37. Upper Abdomen: The adrenal glands are preserved in the upper abdomen. Musculoskeletal: Osteopenia. Scattered degenerative changes along the spine. Slight curvature of the spine. IMPRESSION: Decreasing size of spiculated mass in the medial left upper lobe. Adjacent lung opacities in this location are also decreasing. Further decrease in the left-sided mediastinal lymph nodes. Some new small areas of ground-glass seen in both upper lobes. Attention on follow-up in 3 months. Small pericardial effusion but increased from previous. Also attention on follow-up. Aortic Atherosclerosis (ICD10-I70.0). Electronically Signed   By: Adrianna Horde M.D.   On: 09/04/2023 13:27    ASSESSMENT AND PLAN: This is a very pleasant 63 years old white female with stage IIIa (T2a, N2, M0) non-small cell lung cancer, with unknown subtype secondary to insufficient tissue presented with left upper lobe lung mass in addition to left hilar and AP window lymphadenopathy diagnosed in October 2024.  She underwent a course of concurrent chemoradiation with weekly carboplatin  for AUC of 2 and paclitaxel  45 Mg/M2 status post 6 cycles of treatment.  The Debra Lutz has been tolerating this treatment fairly well. She is currently on consolidation treatment with immunotherapy with Imfinzi  1500 Mg IV every 4 weeks status post 3 cycles.  She has been tolerating this treatment well with Debra concerning adverse effects. She had repeat CT scan of the chest performed recently.  I personally independently reviewed the scan and discussed the result with the Debra Lutz today.  Her scan showed decreasing size of spiculated mass in the medial left upper lobe in addition to decrease in the left sided mediastinal lymph nodes.     Stage 3A non-small cell lung cancer Stage 3A non-small cell lung cancer diagnosed in October 2024. Status post concurrent chemoradiation and currently on  consolidation immunotherapy with durvalumab  every four weeks. Post three cycles with recent CT scan showing further decrease in size of the speculated mass in the medial left upper lobe and left-sided mediastinal lymph node, indicating a positive response to treatment. - Continue durvalumab  every four weeks.  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 Recent chest CT scan shows Debra evidence of pneumonia or cancer progression, supporting the diagnosis of radiation-induced pneumonitis. - Prescribe cough medicine with narcotic to be taken every six hours as needed, especially at night. - Advise to use the cough medicine sparingly due to its narcotic content and cost.   She was advised to call immediately if she has any concerning symptoms in the interval. The Debra Lutz voices understanding of current disease status and treatment options and is in agreement with the current care plan.  All questions were answered. The Debra Lutz knows to call the clinic with any problems, questions or concerns. We can certainly see the Debra Lutz much sooner if necessary.  Disclaimer: This note was dictated with voice recognition software. Similar sounding words can inadvertently be transcribed and may not be corrected upon review.

## 2023-09-14 ENCOUNTER — Other Ambulatory Visit: Payer: Self-pay

## 2023-09-15 ENCOUNTER — Ambulatory Visit: Payer: Self-pay | Admitting: Gastroenterology

## 2023-09-16 ENCOUNTER — Other Ambulatory Visit: Payer: Self-pay

## 2023-10-07 ENCOUNTER — Inpatient Hospital Stay: Payer: Self-pay | Attending: Internal Medicine

## 2023-10-07 ENCOUNTER — Inpatient Hospital Stay: Payer: Self-pay

## 2023-10-07 ENCOUNTER — Inpatient Hospital Stay (HOSPITAL_BASED_OUTPATIENT_CLINIC_OR_DEPARTMENT_OTHER): Payer: Self-pay | Admitting: Internal Medicine

## 2023-10-07 VITALS — BP 112/71 | HR 66 | Temp 98.1°F | Resp 18 | Ht 63.0 in | Wt 165.7 lb

## 2023-10-07 DIAGNOSIS — C3412 Malignant neoplasm of upper lobe, left bronchus or lung: Secondary | ICD-10-CM | POA: Insufficient documentation

## 2023-10-07 DIAGNOSIS — Z5112 Encounter for antineoplastic immunotherapy: Secondary | ICD-10-CM | POA: Insufficient documentation

## 2023-10-07 DIAGNOSIS — F17210 Nicotine dependence, cigarettes, uncomplicated: Secondary | ICD-10-CM | POA: Insufficient documentation

## 2023-10-07 LAB — CBC WITH DIFFERENTIAL (CANCER CENTER ONLY)
Abs Immature Granulocytes: 0.01 10*3/uL (ref 0.00–0.07)
Basophils Absolute: 0 10*3/uL (ref 0.0–0.1)
Basophils Relative: 1 %
Eosinophils Absolute: 0.1 10*3/uL (ref 0.0–0.5)
Eosinophils Relative: 3 %
HCT: 41.3 % (ref 36.0–46.0)
Hemoglobin: 14.2 g/dL (ref 12.0–15.0)
Immature Granulocytes: 0 %
Lymphocytes Relative: 13 %
Lymphs Abs: 0.6 10*3/uL — ABNORMAL LOW (ref 0.7–4.0)
MCH: 29.9 pg (ref 26.0–34.0)
MCHC: 34.4 g/dL (ref 30.0–36.0)
MCV: 86.9 fL (ref 80.0–100.0)
Monocytes Absolute: 0.4 10*3/uL (ref 0.1–1.0)
Monocytes Relative: 9 %
Neutro Abs: 3.5 10*3/uL (ref 1.7–7.7)
Neutrophils Relative %: 74 %
Platelet Count: 251 10*3/uL (ref 150–400)
RBC: 4.75 MIL/uL (ref 3.87–5.11)
RDW: 12.2 % (ref 11.5–15.5)
WBC Count: 4.7 10*3/uL (ref 4.0–10.5)
nRBC: 0 % (ref 0.0–0.2)

## 2023-10-07 LAB — CMP (CANCER CENTER ONLY)
ALT: 14 U/L (ref 0–44)
AST: 12 U/L — ABNORMAL LOW (ref 15–41)
Albumin: 4.4 g/dL (ref 3.5–5.0)
Alkaline Phosphatase: 104 U/L (ref 38–126)
Anion gap: 8 (ref 5–15)
BUN: 11 mg/dL (ref 8–23)
CO2: 28 mmol/L (ref 22–32)
Calcium: 9.6 mg/dL (ref 8.9–10.3)
Chloride: 103 mmol/L (ref 98–111)
Creatinine: 0.74 mg/dL (ref 0.44–1.00)
GFR, Estimated: >60 mL/min (ref >60)
Glucose, Bld: 97 mg/dL (ref 70–99)
Potassium: 3.6 mmol/L (ref 3.5–5.1)
Sodium: 139 mmol/L (ref 135–145)
Total Bilirubin: 0.5 mg/dL (ref 0.0–1.2)
Total Protein: 7.6 g/dL (ref 6.5–8.1)

## 2023-10-07 MED ORDER — SODIUM CHLORIDE 0.9 % IV SOLN: INTRAVENOUS | Status: DC

## 2023-10-07 MED ORDER — SODIUM CHLORIDE 0.9 % IV SOLN
1500.0000 mg | Freq: Once | INTRAVENOUS | Status: AC
  Administered 2023-10-07: 1500 mg via INTRAVENOUS
  Filled 2023-10-07: qty 30

## 2023-10-07 NOTE — Progress Notes (Signed)
 [facility] Telephone:([phone]) [phone]   Fax:(336) [phone]  OFFICE PROGRESS NOTE  Patient, No Pcp Per No address on file  DIAGNOSIS: stage IIIa (T2a, N2, M0) non-small cell lung cancer, with unknown subtype secondary to insufficient tissue presented with left upper lobe lung mass in addition to left hilar and AP window lymphadenopathy diagnosed in October 2024.    PRIOR THERAPY: Concurrent chemoradiation with carboplatin  for AUC of 2 and paclitaxel  45 mg/m2 chemotherapy. Status post 6 cycles. First dose on 03/30/23. Status post 6  cycles.  Last dose was given May 05, 2023.   CURRENT THERAPY: Consolidation treatment with immunotherapy with Imfinzi  1500 Mg IV every 4 weeks.  First dose June 17, 2023.  Status post 4 cycles.  INTERVAL HISTORY: Debra Lutz 63 y.o. female returns to the clinic today for follow-up visit. Discussed the use of AI scribe software for clinical note transcription with the patient, who gave verbal consent to proceed.  History of Present Illness   Debra Lutz is a 63 year old female with stage three non-small cell lung cancer who presents for evaluation before starting cycle number five of immunotherapy.  Diagnosed with stage three non-small cell lung cancer in October 2024, she completed concurrent chemoradiation and is currently undergoing consolidation treatment with durvalumab  (Imfinzi ) every four weeks. She is here for evaluation before starting her fifth cycle of this treatment.  She reports doing well overall with the immunotherapy, describing it as 'going great'. No nausea, vomiting, diarrhea, or itching. She experiences a persistent cough, which she suspects might be related to previous radiation treatment.  She notes a slight weight loss, having dropped a few pounds from her previous weight of 169-170 pounds to 165 pounds, but does not consider this significant. No headaches or other concerning symptoms.        MEDICAL  HISTORY: Past Medical History:  Diagnosis Date   Allergy    Cancer (HCC) 02/2023   lung   COPD (chronic obstructive pulmonary disease) (HCC)    History of radiation therapy    Left Lung SBRT- 03/30/23-05/14/23- Dr. Retta Caster   Lung nodule 09/2022   hypermetabolic left upper lobe    ALLERGIES:  is allergic to wixela inhub [fluticasone-salmeterol].  MEDICATIONS:  Current Outpatient Medications  Medication Sig Dispense Refill   albuterol (ACCUNEB) 0.63 MG/3ML nebulizer solution Take 1 ampule by nebulization every 6 (six) hours as needed for wheezing.     albuterol (VENTOLIN HFA) 108 (90 Base) MCG/ACT inhaler Inhale into the lungs every 6 (six) hours as needed for wheezing or shortness of breath. Using at night     azithromycin  (ZITHROMAX  Z-PAK) 250 MG tablet Take as directed 6 each 0   fexofenadine (ALLEGRA ALLERGY) 180 MG tablet Take 180 mg by mouth as needed.     HYDROcodone  bit-homatropine (HYCODAN) 5-1.5 MG/5ML syrup Take 5 mLs by mouth every 6 (six) hours as needed for cough. 120 mL 0   ibuprofen (ADVIL) 200 MG tablet Take 200 mg by mouth every 4 (four) hours as needed for mild pain (pain score 1-3).     Multiple Vitamins-Minerals (MULTIVITAMINS THER. W/MINERALS) TABS tablet Take 1 tablet by mouth daily.     No current facility-administered medications for this visit.    SURGICAL HISTORY:  Past Surgical History:  Procedure Laterality Date   BRONCHIAL NEEDLE ASPIRATION BIOPSY  03/02/2023   Procedure: BRONCHIAL NEEDLE ASPIRATION BIOPSIES;  Surgeon: Prudy Brownie, DO;  Location: [facility];  Service: Pulmonary;;  KNEE SURGERY Left 2017   knee repair with hardware   VIDEO BRONCHOSCOPY WITH ENDOBRONCHIAL ULTRASOUND Left 03/02/2023   Procedure: VIDEO BRONCHOSCOPY WITH ENDOBRONCHIAL ULTRASOUND;  Surgeon: Prudy Brownie, DO;  Location: [facility];  Service: Pulmonary;  Laterality: Left;    REVIEW OF SYSTEMS:  A comprehensive review of systems was negative except for:  Respiratory: positive for cough   PHYSICAL EXAMINATION: General appearance: alert, cooperative, and no distress Head: Normocephalic, without obvious abnormality, atraumatic Neck: no adenopathy, no JVD, supple, symmetrical, trachea midline, and thyroid  not enlarged, symmetric, no tenderness/mass/nodules Lymph nodes: Cervical, supraclavicular, and axillary nodes normal. Resp: clear to auscultation bilaterally Back: symmetric, no curvature. ROM normal. No CVA tenderness. Cardio: regular rate and rhythm, S1, S2 normal, no murmur, click, rub or gallop GI: soft, non-tender; bowel sounds normal; no masses,  no organomegaly Extremities: extremities normal, atraumatic, no cyanosis or edema  ECOG PERFORMANCE STATUS: 1 - Symptomatic but completely ambulatory  Blood pressure 112/71, pulse 66, temperature 98.1 F (36.7 C), temperature source Tympanic, resp. rate 18, height 5\' 3"  (1.6 m), weight 165 lb 11.2 oz (75.2 kg), SpO2 96%.  LABORATORY DATA: Lab Results  Component Value Date   WBC 4.7 10/07/2023   HGB 14.2 10/07/2023   HCT 41.3 10/07/2023   MCV 86.9 10/07/2023   PLT 251 10/07/2023      Chemistry      Component Value Date/Time   NA 139 10/07/2023 0955   K 3.6 10/07/2023 0955   CL 103 10/07/2023 0955   CO2 28 10/07/2023 0955   BUN 11 10/07/2023 0955   CREATININE 0.74 10/07/2023 0955      Component Value Date/Time   CALCIUM 9.6 10/07/2023 0955   ALKPHOS 104 10/07/2023 0955   AST 12 (L) 10/07/2023 0955   ALT 14 10/07/2023 0955   BILITOT 0.5 10/07/2023 0955       RADIOGRAPHIC STUDIES: No results found.   ASSESSMENT AND PLAN: This is a very pleasant 63 years old white female with stage IIIa (T2a, N2, M0) non-small cell lung cancer, with unknown subtype secondary to insufficient tissue presented with left upper lobe lung mass in addition to left hilar and AP window lymphadenopathy diagnosed in October 2024.  She underwent a course of concurrent chemoradiation with weekly  carboplatin  for AUC of 2 and paclitaxel  45 Mg/M2 status post 6 cycles of treatment.  The patient has been tolerating this treatment fairly well. She is currently on consolidation treatment with immunotherapy with Imfinzi  1500 Mg IV every 4 weeks status post 4 cycles.  She has been tolerating this treatment well with no concerning adverse effects. Assessment and Plan    Non-small cell lung cancer, stage 3 Stage 3 non-small cell lung cancer diagnosed in October 2024. Status post concurrent chemoradiation. Currently on consolidation treatment with durvalumab  (Imfinzi ) every four weeks. No issues with the last cycle of immunotherapy. Lab work is satisfactory. Mild weight loss noted but not significant. - Proceed with cycle number five of durvalumab  (Imfinzi ) today - Schedule next treatment cycle in four weeks  Radiation-induced cough Persistent cough likely secondary to previous radiation therapy. No new symptoms reported.   The patient was advised to call immediately if she has any concerning symptoms in the interval. The patient voices understanding of current disease status and treatment options and is in agreement with the current care plan.  All questions were answered. The patient knows to call the clinic with any problems, questions or concerns. We can certainly see the patient much sooner  if necessary.  Disclaimer: This note was dictated with voice recognition software. Similar sounding words can inadvertently be transcribed and may not be corrected upon review.

## 2023-10-07 NOTE — Patient Instructions (Signed)

## 2023-10-28 NOTE — Progress Notes (Signed)
 [facility] OFFICE PROGRESS NOTE  Patient, No Pcp Per No address on file  DIAGNOSIS: Stage IIIa (T2a, N2, M0) non-small cell lung cancer, with unknown subtype secondary to insufficient tissue presented with left upper lobe lung mass in addition to left hilar and AP window lymphadenopathy diagnosed in October 2024.   PRIOR THERAPY: Concurrent chemoradiation with carboplatin  for AUC of 2 and paclitaxel  45 mg/m2 chemotherapy. Status post 6 cycles. First dose on 03/30/23. Status post 6 cycles. Last dose was given May 05, 2023.   CURRENT THERAPY: Consolidation treatment with immunotherapy with Imfinzi  1500 Mg IV every 4 weeks. First dose June 17, 2023. Status post 5 cycle.   INTERVAL HISTORY: Destenie Ingber 63 y.o. female returns to the clinic today for a follow-up. She was last seen in the clinic 1 month ago by Dr. Sherrod. She is currently undergoing consolidation immunotherapy with Imfinzi  which she is tolerating well.  She is feeling well today but has had a chronic cough for several months, mostly at night. She tried hycodan and Robitussin which are not effective. She overall thinks the cough is similar to her baseline. She does still smoke intermittently and has been having trouble quitting. She has a history of smoking and finds it challenging to quit due to her household environment where everyone smokes. She has nebulizer and inhalers at home. She has not used her nebulizer in about a week. She uses mucinex. Her shortness of breath is stable. She denies fevers, chest pain, or hemoptysis.   Denies any nausea, vomiting, diarrhea, or constipation.  Denies any rashes or skin changes. She is here today for evaluation repeat blood work before undergoing cycle #6.     MEDICAL HISTORY: Past Medical History:  Diagnosis Date   Allergy    Cancer (HCC) 02/2023   lung   COPD (chronic obstructive pulmonary disease) (HCC)    History of radiation therapy    Left Lung SBRT-  03/30/23-05/14/23- Dr. Lynwood Nasuti   Lung nodule 09/2022   hypermetabolic left upper lobe    ALLERGIES:  is allergic to wixela inhub [fluticasone-salmeterol].  MEDICATIONS:  Current Outpatient Medications  Medication Sig Dispense Refill   albuterol (ACCUNEB) 0.63 MG/3ML nebulizer solution Take 1 ampule by nebulization every 6 (six) hours as needed for wheezing.     albuterol (VENTOLIN HFA) 108 (90 Base) MCG/ACT inhaler Inhale into the lungs every 6 (six) hours as needed for wheezing or shortness of breath. Using at night     azithromycin  (ZITHROMAX  Z-PAK) 250 MG tablet Take as directed 6 each 0   fexofenadine (ALLEGRA ALLERGY) 180 MG tablet Take 180 mg by mouth as needed.     HYDROcodone  bit-homatropine (HYCODAN) 5-1.5 MG/5ML syrup Take 5 mLs by mouth every 6 (six) hours as needed for cough. 120 mL 0   ibuprofen (ADVIL) 200 MG tablet Take 200 mg by mouth every 4 (four) hours as needed for mild pain (pain score 1-3).     Multiple Vitamins-Minerals (MULTIVITAMINS THER. W/MINERALS) TABS tablet Take 1 tablet by mouth daily.     No current facility-administered medications for this visit.    SURGICAL HISTORY:  Past Surgical History:  Procedure Laterality Date   BRONCHIAL NEEDLE ASPIRATION BIOPSY  03/02/2023   Procedure: BRONCHIAL NEEDLE ASPIRATION BIOPSIES;  Surgeon: Brenna Adine CROME, DO;  Location: [facility];  Service: Pulmonary;;   KNEE SURGERY Left 2017   knee repair with hardware   VIDEO BRONCHOSCOPY WITH ENDOBRONCHIAL ULTRASOUND Left 03/02/2023   Procedure: VIDEO BRONCHOSCOPY WITH  ENDOBRONCHIAL ULTRASOUND;  Surgeon: Brenna Adine CROME, DO;  Location: [facility];  Service: Pulmonary;  Laterality: Left;    REVIEW OF SYSTEMS:   Review of Systems  Constitutional: Positive for stable fatigue. Negative for appetite change, chills,  fever and unexpected weight change.  HENT: Positive for nasal congestion. Negative for mouth sores, nosebleeds, sore throat and trouble swallowing.    Eyes: Negative for eye problems and icterus.  Respiratory: Positive for stable cough, wheezing, and intermittent dyspnea on exertion. Negative for hemoptysis.  Cardiovascular: Negative for chest pain and leg swelling.  Gastrointestinal: Negative for abdominal pain, constipation, diarrhea, nausea and vomiting.  Genitourinary: Negative for bladder incontinence, difficulty urinating, dysuria, frequency and hematuria.   Musculoskeletal: Negative for back pain, gait problem, neck pain and neck stiffness.  Skin: Negative for itching and rash.  Neurological: Negative for dizziness, extremity weakness, gait problem, headaches, light-headedness and seizures.  Hematological: Negative for adenopathy. Does not bruise/bleed easily.  Psychiatric/Behavioral: Negative for confusion, depression and sleep disturbance. The patient is not nervous/anxious.    PHYSICAL EXAMINATION:  There were no vitals taken for this visit.  ECOG PERFORMANCE STATUS: 1  Physical Exam  Constitutional: Oriented to person, place, and time and well-developed, well-nourished, and in no distress.  HENT:  Head: Normocephalic and atraumatic.  Mouth/Throat: Oropharynx is clear and moist. No oropharyngeal exudate.  Eyes: Conjunctivae are normal. Right eye exhibits no discharge. Left eye exhibits no discharge. No scleral icterus.  Neck: Normal range of motion. Neck supple.  Cardiovascular: Normal rate, regular rhythm, normal heart sounds and intact distal pulses.   Pulmonary/Chest: Effort normal. Some mild wheezing/rhonchi noted bilaterally. No respiratory distress. No rales.  Abdominal: Soft. Bowel sounds are normal. Exhibits no distension and no mass. There is no tenderness.  Musculoskeletal: Normal range of motion. Exhibits no edema.  Lymphadenopathy:    No cervical adenopathy.  Neurological: Alert and oriented to person, place, and time. Exhibits normal muscle tone. Gait normal. Coordination normal.  Skin: Skin is warm and dry.  No rash noted. Not diaphoretic. No erythema. No pallor.  Psychiatric: Mood, memory and judgment normal.  Vitals reviewed.  LABORATORY DATA: Lab Results  Component Value Date   WBC 4.7 10/07/2023   HGB 14.2 10/07/2023   HCT 41.3 10/07/2023   MCV 86.9 10/07/2023   PLT 251 10/07/2023      Chemistry      Component Value Date/Time   NA 139 10/07/2023 0955   K 3.6 10/07/2023 0955   CL 103 10/07/2023 0955   CO2 28 10/07/2023 0955   BUN 11 10/07/2023 0955   CREATININE 0.74 10/07/2023 0955      Component Value Date/Time   CALCIUM 9.6 10/07/2023 0955   ALKPHOS 104 10/07/2023 0955   AST 12 (L) 10/07/2023 0955   ALT 14 10/07/2023 0955   BILITOT 0.5 10/07/2023 0955       RADIOGRAPHIC STUDIES:  No results found.   ASSESSMENT/PLAN:  This is a very pleasant 63 year old Caucasian female diagnosed with stage IIIa (T2a, N2, M0) non-small cell lung cancer, with unknown subtype secondary to insufficient tissue presented with left upper lobe lung mass in addition to left hilar and AP window lymphadenopathy diagnosed in October 2024.    She underwent a course of concurrent chemoradiation with weekly carboplatin  for AUC of 2 and paclitaxel  45 Mg/M2 status post 6 cycles of treatment.    He is currently undergoing consolidation immunotherapy with Imfinzi  1500 mg IV every 4 weeks.  She is status post her 5th cycle  of treatment which was given on 06/17/23.    Labs were reviewed.  Recommend that she proceed with cycle #6 today scheduled.  I will arrange for a restaging CT scan of the chest prior to her next cycle of treatment to assess her cough and response to treatment.   She does have persistent wheezing on exam. She has had a stable chronic cough for several months and denies changes with shortness of breath. She is well appearing and no leukocytosis on labs. We will arrange for CXR today to ensure no acute process. I did send her doxycycline should she have a COPD flare up or underlying  infection. She was strongly encouraged to quit smoking. Reminded her to avoid sunlight with doxycyline. I also sent her tessalon. Would recommend avoiding steroids with immunotherapy unless needed. She will also use her inhalers and nebulizer at home as she has not used this in a week.   The patient was advised to call immediately if she has any concerning symptoms in the interval. The patient voices understanding of current disease status and treatment options and is in agreement with the current care plan. All questions were answered. The patient knows to call the clinic with any problems, questions or concerns. We can certainly see the patient much sooner if necessary     No orders of the defined types were placed in this encounter.     The total time spent in the appointment was 20-29 minutes  Amahia Madonia L Shahzaib Azevedo, PA-C 10/28/23

## 2023-11-04 ENCOUNTER — Inpatient Hospital Stay: Payer: Self-pay

## 2023-11-04 ENCOUNTER — Other Ambulatory Visit: Payer: Self-pay | Admitting: Physician Assistant

## 2023-11-04 ENCOUNTER — Telehealth: Payer: Self-pay | Admitting: Internal Medicine

## 2023-11-04 ENCOUNTER — Inpatient Hospital Stay: Payer: Self-pay | Attending: Internal Medicine

## 2023-11-04 ENCOUNTER — Ambulatory Visit (HOSPITAL_COMMUNITY)
Admission: RE | Admit: 2023-11-04 | Discharge: 2023-11-04 | Disposition: A | Discharge status: Home / Self Care | Payer: Self-pay | Source: Ambulatory Visit | Attending: Physician Assistant | Admitting: Physician Assistant

## 2023-11-04 ENCOUNTER — Inpatient Hospital Stay: Payer: Self-pay | Attending: Internal Medicine | Admitting: Physician Assistant

## 2023-11-04 VITALS — BP 112/81 | HR 80 | Temp 98.3°F | Resp 15 | Wt 166.8 lb

## 2023-11-04 DIAGNOSIS — C3412 Malignant neoplasm of upper lobe, left bronchus or lung: Secondary | ICD-10-CM

## 2023-11-04 DIAGNOSIS — Z5112 Encounter for antineoplastic immunotherapy: Secondary | ICD-10-CM | POA: Insufficient documentation

## 2023-11-04 DIAGNOSIS — F17210 Nicotine dependence, cigarettes, uncomplicated: Secondary | ICD-10-CM | POA: Insufficient documentation

## 2023-11-04 DIAGNOSIS — Z79620 Long term (current) use of immunosuppressive biologic: Secondary | ICD-10-CM | POA: Insufficient documentation

## 2023-11-04 LAB — CBC WITH DIFFERENTIAL (CANCER CENTER ONLY)
Abs Immature Granulocytes: 0.02 10*3/uL (ref 0.00–0.07)
Basophils Absolute: 0 10*3/uL (ref 0.0–0.1)
Basophils Relative: 1 %
Eosinophils Absolute: 0.2 10*3/uL (ref 0.0–0.5)
Eosinophils Relative: 3 %
HCT: 38.8 % (ref 36.0–46.0)
Hemoglobin: 13.2 g/dL (ref 12.0–15.0)
Immature Granulocytes: 0 %
Lymphocytes Relative: 13 %
Lymphs Abs: 0.7 10*3/uL (ref 0.7–4.0)
MCH: 29.9 pg (ref 26.0–34.0)
MCHC: 34 g/dL (ref 30.0–36.0)
MCV: 88 fL (ref 80.0–100.0)
Monocytes Absolute: 0.4 10*3/uL (ref 0.1–1.0)
Monocytes Relative: 8 %
Neutro Abs: 4.1 10*3/uL (ref 1.7–7.7)
Neutrophils Relative %: 75 %
Platelet Count: 278 10*3/uL (ref 150–400)
RBC: 4.41 MIL/uL (ref 3.87–5.11)
RDW: 12.6 % (ref 11.5–15.5)
WBC Count: 5.5 10*3/uL (ref 4.0–10.5)
nRBC: 0 % (ref 0.0–0.2)

## 2023-11-04 LAB — CMP (CANCER CENTER ONLY)
ALT: 10 U/L (ref 0–44)
AST: 11 U/L — ABNORMAL LOW (ref 15–41)
Albumin: 4 g/dL (ref 3.5–5.0)
Alkaline Phosphatase: 117 U/L (ref 38–126)
Anion gap: 8 (ref 5–15)
BUN: 11 mg/dL (ref 8–23)
CO2: 28 mmol/L (ref 22–32)
Calcium: 9.5 mg/dL (ref 8.9–10.3)
Chloride: 103 mmol/L (ref 98–111)
Creatinine: 0.78 mg/dL (ref 0.44–1.00)
GFR, Estimated: >60 mL/min (ref >60)
Glucose, Bld: 101 mg/dL — ABNORMAL HIGH (ref 70–99)
Potassium: 3.9 mmol/L (ref 3.5–5.1)
Sodium: 139 mmol/L (ref 135–145)
Total Bilirubin: 0.5 mg/dL (ref 0.0–1.2)
Total Protein: 7.2 g/dL (ref 6.5–8.1)

## 2023-11-04 LAB — TSH: TSH: 2.86 u[IU]/mL (ref 0.350–4.500)

## 2023-11-04 MED ORDER — SODIUM CHLORIDE 0.9 % IV SOLN
1500.0000 mg | Freq: Once | INTRAVENOUS | Status: AC
  Administered 2023-11-04: 1500 mg via INTRAVENOUS
  Filled 2023-11-04: qty 30

## 2023-11-04 MED ORDER — DOXYCYCLINE HYCLATE 100 MG PO TABS: 100.0000 mg | ORAL_TABLET | Freq: Two times a day (BID) | ORAL | 0 refills | Status: DC

## 2023-11-04 MED ORDER — SODIUM CHLORIDE 0.9 % IV SOLN: INTRAVENOUS | Status: DC

## 2023-11-04 MED ORDER — METHYLPREDNISOLONE 4 MG PO TBPK: ORAL_TABLET | ORAL | 0 refills | Status: DC

## 2023-11-04 MED ORDER — BENZONATATE 100 MG PO CAPS: 100.0000 mg | ORAL_CAPSULE | Freq: Three times a day (TID) | ORAL | 2 refills | Status: AC | PRN

## 2023-11-04 NOTE — Telephone Encounter (Signed)
Scheduled appointments with the patient

## 2023-11-04 NOTE — Patient Instructions (Signed)

## 2023-11-04 NOTE — Progress Notes (Signed)
 I called the patient to review her chest x-ray.  I also reviewed with Dr. Sherrod.  It showed increased interstitial markings in bilateral lung field suggestive of pulmonary edema, infection such as atypical or viral pneumonias, or lymphangitic carcinomatosis.  Of note the patient is also on immunotherapy.  I sent antibiotics with doxycycline to the pharmacy as well as steroids should this be inflammatory in nature.  I have changed her CT scan to stat.  Will also see the patient back early next week to review the scan.  The patient understands should she develop any new or worsening symptoms such as shortness of breath, worsening cough, fevers, hypoxia, etc. to seek emergency room evaluation.

## 2023-11-05 ENCOUNTER — Other Ambulatory Visit: Payer: Self-pay

## 2023-11-05 ENCOUNTER — Ambulatory Visit (HOSPITAL_BASED_OUTPATIENT_CLINIC_OR_DEPARTMENT_OTHER)
Admission: RE | Admit: 2023-11-05 | Discharge: 2023-11-05 | Disposition: A | Discharge status: Home / Self Care | Payer: Self-pay | Source: Ambulatory Visit | Attending: Physician Assistant | Admitting: Physician Assistant

## 2023-11-05 DIAGNOSIS — C3412 Malignant neoplasm of upper lobe, left bronchus or lung: Secondary | ICD-10-CM | POA: Insufficient documentation

## 2023-11-05 LAB — T4: T4, Total: 9 ug/dL (ref 4.5–12.0)

## 2023-11-05 MED ORDER — IOHEXOL 300 MG/ML  SOLN
75.0000 mL | Freq: Once | INTRAMUSCULAR | Status: AC | PRN
  Administered 2023-11-05: 75 mL via INTRAVENOUS

## 2023-11-06 ENCOUNTER — Other Ambulatory Visit: Payer: Self-pay

## 2023-11-10 ENCOUNTER — Ambulatory Visit: Payer: Self-pay | Admitting: Internal Medicine

## 2023-11-11 ENCOUNTER — Ambulatory Visit: Payer: Self-pay | Admitting: Internal Medicine

## 2023-11-16 ENCOUNTER — Other Ambulatory Visit: Payer: Self-pay

## 2023-11-17 ENCOUNTER — Other Ambulatory Visit: Payer: Self-pay

## 2023-11-30 NOTE — Progress Notes (Unsigned)
 [facility] OFFICE PROGRESS NOTE  Patient, No Pcp Per No address on file  DIAGNOSIS:  Stage IIIa (T2a, N2, M0) non-small cell lung cancer, with unknown subtype secondary to insufficient tissue presented with left upper lobe lung mass in addition to left hilar and AP window lymphadenopathy diagnosed in October 2024.   PRIOR THERAPY: Concurrent chemoradiation with carboplatin  for AUC of 2 and paclitaxel  45 mg/m2 chemotherapy. Status post 6 cycles. First dose on 03/30/23. Status post 6 cycles. Last dose was given May 05, 2023.   CURRENT THERAPY: Consolidation treatment with immunotherapy with Imfinzi  1500 Mg IV every 4 weeks. First dose June 17, 2023. Status post 6 cycle.   INTERVAL HISTORY: Debra Lutz 63 y.o. female returns to the clinic today for a follow-up. She was last seen in the clinic 1 month ago by myself. She is currently undergoing consolidation immunotherapy with Imfinzi  which she is tolerating well.   She is feeling well today but has had a chronic cough for several months. She tried hycodan and Robitussin which are not effective. She overall thinks the cough is similar to her baseline. She does still smoke intermittently. I gave her tessalon  at her last appointment which helps some. I also gave her antibiotics. She has a history of smoking and finds it challenging to quit due to her household environment where everyone smokes. She has nebulizer and inhalers at home.  She uses mucinex. Her shortness of breath is stable. She denies fevers, chest pain, or hemoptysis. Denies any nausea, vomiting, diarrhea, or constipation.  Denies any rashes or skin changes. She is here today for evaluation and repeat blood work before undergoing cycle #7.   MEDICAL HISTORY: Past Medical History:  Diagnosis Date   Allergy    Cancer (HCC) 02/2023   lung   COPD (chronic obstructive pulmonary disease) (HCC)    History of radiation therapy    Left Lung SBRT- 03/30/23-05/14/23- Dr.  Lynwood Nasuti   Lung nodule 09/2022   hypermetabolic left upper lobe    ALLERGIES:  is allergic to wixela inhub [fluticasone-salmeterol].  MEDICATIONS:  Current Outpatient Medications  Medication Sig Dispense Refill   albuterol (ACCUNEB) 0.63 MG/3ML nebulizer solution Take 1 ampule by nebulization every 6 (six) hours as needed for wheezing.     albuterol (VENTOLIN HFA) 108 (90 Base) MCG/ACT inhaler Inhale into the lungs every 6 (six) hours as needed for wheezing or shortness of breath. Using at night     azithromycin  (ZITHROMAX  Z-PAK) 250 MG tablet Take as directed 6 each 0   benzonatate  (TESSALON ) 100 MG capsule Take 1 capsule (100 mg total) by mouth 3 (three) times daily as needed. 30 capsule 2   doxycycline  (VIBRA -TABS) 100 MG tablet Take 1 tablet (100 mg total) by mouth 2 (two) times daily. 14 tablet 0   fexofenadine (ALLEGRA ALLERGY) 180 MG tablet Take 180 mg by mouth as needed.     HYDROcodone  bit-homatropine (HYCODAN) 5-1.5 MG/5ML syrup Take 5 mLs by mouth every 6 (six) hours as needed for cough. 120 mL 0   ibuprofen (ADVIL) 200 MG tablet Take 200 mg by mouth every 4 (four) hours as needed for mild pain (pain score 1-3).     methylPREDNISolone  (MEDROL  DOSEPAK) 4 MG TBPK tablet Use as instructed 21 tablet 0   Multiple Vitamins-Minerals (MULTIVITAMINS THER. W/MINERALS) TABS tablet Take 1 tablet by mouth daily.     No current facility-administered medications for this visit.    SURGICAL HISTORY:  Past Surgical History:  Procedure  Laterality Date   BRONCHIAL NEEDLE ASPIRATION BIOPSY  03/02/2023   Procedure: BRONCHIAL NEEDLE ASPIRATION BIOPSIES;  Surgeon: Brenna Adine CROME, DO;  Location: [facility];  Service: Pulmonary;;   KNEE SURGERY Left 2017   knee repair with hardware   VIDEO BRONCHOSCOPY WITH ENDOBRONCHIAL ULTRASOUND Left 03/02/2023   Procedure: VIDEO BRONCHOSCOPY WITH ENDOBRONCHIAL ULTRASOUND;  Surgeon: Brenna Adine CROME, DO;  Location: [facility];  Service: Pulmonary;   Laterality: Left;    REVIEW OF SYSTEMS:   Review of Systems  Constitutional: Positive for stable fatigue. Negative for appetite change, chills,  fever and unexpected weight change.  HENT: Negative for mouth sores, nosebleeds, sore throat and trouble swallowing.   Eyes: Negative for eye problems and icterus.  Respiratory: Positive for stable cough, wheezing, and intermittent dyspnea on exertion. Negative for hemoptysis.  Cardiovascular: Negative for chest pain and leg swelling.  Gastrointestinal: Negative for abdominal pain, constipation, diarrhea, nausea and vomiting.  Genitourinary: Negative for bladder incontinence, difficulty urinating, dysuria, frequency and hematuria.   Musculoskeletal: Negative for back pain, gait problem, neck pain and neck stiffness.  Skin: Negative for itching and rash.  Neurological: Negative for dizziness, extremity weakness, gait problem, headaches, light-headedness and seizures.  Hematological: Negative for adenopathy. Does not bruise/bleed easily.  Psychiatric/Behavioral: Negative for confusion, depression and sleep disturbance. The patient is not nervous/anxious.    PHYSICAL EXAMINATION:  There were no vitals taken for this visit.  ECOG PERFORMANCE STATUS: 1  Physical Exam  Constitutional: Oriented to person, place, and time and well-developed, well-nourished, and in no distress.  HENT:  Head: Normocephalic and atraumatic.  Mouth/Throat: Oropharynx is clear and moist. No oropharyngeal exudate.  Eyes: Conjunctivae are normal. Right eye exhibits no discharge. Left eye exhibits no discharge. No scleral icterus.  Neck: Normal range of motion. Neck supple.  Cardiovascular: Normal rate, regular rhythm, normal heart sounds and intact distal pulses.   Pulmonary/Chest: Effort normal. Some mild wheezing/rhonchi noted bilaterally. No respiratory distress. No rales.  Abdominal: Soft. Bowel sounds are normal. Exhibits no distension and no mass. There is no  tenderness.  Musculoskeletal: Normal range of motion. Exhibits no edema.  Lymphadenopathy:    No cervical adenopathy.  Neurological: Alert and oriented to person, place, and time. Exhibits normal muscle tone. Gait normal. Coordination normal.  Skin: Skin is warm and dry. No rash noted. Not diaphoretic. No erythema. No pallor.  Psychiatric: Mood, memory and judgment normal.  Vitals reviewed.    LABORATORY DATA: Lab Results  Component Value Date   WBC 5.5 11/04/2023   HGB 13.2 11/04/2023   HCT 38.8 11/04/2023   MCV 88.0 11/04/2023   PLT 278 11/04/2023      Chemistry      Component Value Date/Time   NA 139 11/04/2023 0938   K 3.9 11/04/2023 0938   CL 103 11/04/2023 0938   CO2 28 11/04/2023 0938   BUN 11 11/04/2023 0938   CREATININE 0.78 11/04/2023 0938      Component Value Date/Time   CALCIUM 9.5 11/04/2023 0938   ALKPHOS 117 11/04/2023 0938   AST 11 (L) 11/04/2023 0938   ALT 10 11/04/2023 0938   BILITOT 0.5 11/04/2023 9061       RADIOGRAPHIC STUDIES:  CT Chest W Contrast Result Date: 11/05/2023 CLINICAL DATA:  Non-small cell lung cancer (NSCLC), non-metastatic, assess treatment response Patient with lung cancer with persistent cough. * Tracking Code: BO * EXAM: CT CHEST WITH CONTRAST TECHNIQUE: Multidetector CT imaging of the chest was performed during intravenous contrast administration. RADIATION  DOSE REDUCTION: This exam was performed according to the departmental dose-optimization program which includes automated exposure control, adjustment of the mA and/or kV according to patient size and/or use of iterative reconstruction technique. CONTRAST:  75mL OMNIPAQUE  IOHEXOL  300 MG/ML  SOLN COMPARISON:  CT scan chest from 08/31/2023. FINDINGS: Cardiovascular: Normal cardiac size. Stable mild pericardial effusion. No aortic aneurysm. There are coronary artery calcifications, in keeping with coronary artery disease. There are also mild peripheral atherosclerotic vascular  calcifications of thoracic aorta and its major branches. Mediastinum/Nodes: Visualized thyroid  gland appears grossly unremarkable. No solid / cystic mediastinal masses. The esophagus is nondistended precluding optimal assessment. No axillary, mediastinal or hilar lymphadenopathy by size criteria. Lungs/Pleura: The central tracheo-bronchial tree is patent. Redemonstration of irregular area of scarring in the left upper lobe with associated bronchiectatic changes. However, there are new heterogeneous opacities in the left lung upper lobe, mainly in the apicoposterior segment (series 302, images 30-36 and 42), which are favored to represent superimposed pneumonia. There are diffuse mild-to-moderate upper lobe predominant centrilobular emphysematous changes. Bilateral lungs are otherwise essentially clear. No pleural effusion or pneumothorax. No suspicious lung nodule. Upper Abdomen: Visualized upper abdominal viscera within normal limits. Musculoskeletal: The visualized soft tissues of the chest wall are grossly unremarkable. No suspicious osseous lesions. There are mild multilevel degenerative changes in the visualized spine. IMPRESSION: 1. Redemonstration of irregular area of scarring in the left upper lobe with associated bronchiectatic changes. However, there are new heterogeneous opacities in the left lung upper lobe, mainly in the apicoposterior segment, which are favored to represent superimposed pneumonia. Follow-up to resolution is recommended. 2. Otherwise essentially unremarkable exam, as described above. 3. No metastatic disease identified in the chest. Electronically Signed   By: Ree Molt M.D.   On: 11/05/2023 13:08   DG Chest 2 View Result Date: 11/04/2023 CLINICAL DATA:  Wheezing, lung cancer, chest CT evaluate for infection. EXAM: CHEST - 2 VIEW COMPARISON:  August 31, 2023 FINDINGS: The heart size and mediastinal contours are within normal limits. Ill-defined left perihilar opacity with air  bronchogram correlating to known treated malignancy. Increased reticulonodular interstitial markings of the lung fields. No consolidation. No definite pleural effusion. Previously seen multiple ground-glass additional nodules in bilateral upper lobes on CT are below radiographic resolution. The visualized skeletal structures are unremarkable. Degenerative changes of the spine. IMPRESSION: Left upper lobe perihilar mass with air bronchogram consistent with known treated malignancy. Increased interstitial markings of bilateral lung fields suggestive of pulmonary edema, infections including atypical and viral pneumonias, lymphangitic carcinomatosis. No pleural effusion. Correlate with clinical findings. Electronically Signed   By: Megan  Zare M.D.   On: 11/04/2023 14:00     ASSESSMENT/PLAN:  This is a very pleasant 63 year old Caucasian female diagnosed with stage IIIa (T2a, N2, M0) non-small cell lung cancer, with unknown subtype secondary to insufficient tissue presented with left upper lobe lung mass in addition to left hilar and AP window lymphadenopathy diagnosed in October 2024.    She underwent a course of concurrent chemoradiation with weekly carboplatin  for AUC of 2 and paclitaxel  45 Mg/M2 status post 6 cycles of treatment.    He is currently undergoing consolidation immunotherapy with Imfinzi  1500 mg IV every 4 weeks.  She is status post her 6th cycle of treatment which was given on 06/17/23.   The patient was seen with Dr. Sherrod today.  Dr. Sherrod personally and independently reviewed the scan and discussed results with the patient today.  The scan showed no disease progression. He believes  the new heterogeneous opacities in the left lung upper lobe, mainly in the apicoposterior segment is due to radiation. She did complete antibiotics for possible superimposed pneumonia a few weeks ago.  Dr. Sherrod recommends she continue on the same treatment.   Labs were reviewed.  Recommend that she  proceed with cycle #7 today scheduled.   We will see her back in 4 weeks for labs and follow up before undergoing cycle #8.   Chronic cough with wheezing Persistent cough with wheezing, unchanged. Smoking likely exacerbates symptoms. - Review scan images with [facility]. - Continue Mucinex and nebulizer treatments. - Continue Tessalon  perles. - Advise on smoking cessation, including patches and gum.  The patient was advised to call immediately if she has any concerning symptoms in the interval. The patient voices understanding of current disease status and treatment options and is in agreement with the current care plan. All questions were answered. The patient knows to call the clinic with any problems, questions or concerns. We can certainly see the patient much sooner if necessary    No orders of the defined types were placed in this encounter.    Elayah Klooster L Shahid Flori, PA-C 11/30/23  ADDENDUM: Hematology/Oncology Attending:  I had a face-to-face encounter with the patient today.  I reviewed her records, lab, scan and recommended her care plan.  This is a very pleasant 63 years old white female with a stage IIIa non-small cell lung cancer diagnosed in October 2024 status post a course of concurrent chemoradiation with weekly carboplatin  and paclitaxel  and currently undergoing consolidation treatment with immunotherapy with Imfinzi  every 4 weeks status post 6 cycles.  The patient has been tolerating this treatment fairly well except for increasing cough and wheezing at times.  She is using Mucinex and nebulizer on as-needed basis.  She is also using Tessalon  Perles for the cough.  She had a repeat CT scan of the chest performed recently.  I personally and independently reviewed the scan images and discussed the result with the patient today.  Her scan showed no clear evidence for progression but there was increased density in the previously radiated left hilar area. I recommended for  her to continue her current treatment with Imfinzi  and she will proceed with cycle #7 today.  She was advised to call immediately if she has any other concerning symptoms in the interval. The total time spent in the appointment was 30 minutes including review of chart and various tests results, discussions about plan of care and coordination of care plan . Disclaimer: This note was dictated with voice recognition software. Similar sounding words can inadvertently be transcribed and may be missed upon review. Sherrod MARLA Sherrod, MD

## 2023-12-03 ENCOUNTER — Inpatient Hospital Stay: Payer: Self-pay

## 2023-12-03 ENCOUNTER — Inpatient Hospital Stay: Payer: Self-pay | Admitting: Physician Assistant

## 2023-12-03 VITALS — BP 115/75 | HR 76 | Temp 98.6°F | Resp 18

## 2023-12-03 VITALS — BP 122/73 | HR 73 | Temp 98.0°F | Resp 13 | Wt 165.9 lb

## 2023-12-03 DIAGNOSIS — C3412 Malignant neoplasm of upper lobe, left bronchus or lung: Secondary | ICD-10-CM

## 2023-12-03 DIAGNOSIS — Z5112 Encounter for antineoplastic immunotherapy: Secondary | ICD-10-CM

## 2023-12-03 LAB — CBC WITH DIFFERENTIAL (CANCER CENTER ONLY)
Abs Immature Granulocytes: 0.01 K/uL (ref 0.00–0.07)
Basophils Absolute: 0 K/uL (ref 0.0–0.1)
Basophils Relative: 1 %
Eosinophils Absolute: 0.2 K/uL (ref 0.0–0.5)
Eosinophils Relative: 3 %
HCT: 40.9 % (ref 36.0–46.0)
Hemoglobin: 13.8 g/dL (ref 12.0–15.0)
Immature Granulocytes: 0 %
Lymphocytes Relative: 14 %
Lymphs Abs: 0.7 K/uL (ref 0.7–4.0)
MCH: 29.5 pg (ref 26.0–34.0)
MCHC: 33.7 g/dL (ref 30.0–36.0)
MCV: 87.4 fL (ref 80.0–100.0)
Monocytes Absolute: 0.4 K/uL (ref 0.1–1.0)
Monocytes Relative: 8 %
Neutro Abs: 3.7 K/uL (ref 1.7–7.7)
Neutrophils Relative %: 74 %
Platelet Count: 275 K/uL (ref 150–400)
RBC: 4.68 MIL/uL (ref 3.87–5.11)
RDW: 13.1 % (ref 11.5–15.5)
WBC Count: 5 K/uL (ref 4.0–10.5)
nRBC: 0 % (ref 0.0–0.2)

## 2023-12-03 LAB — CMP (CANCER CENTER ONLY)
ALT: 13 U/L (ref 0–44)
AST: 13 U/L — ABNORMAL LOW (ref 15–41)
Albumin: 4 g/dL (ref 3.5–5.0)
Alkaline Phosphatase: 114 U/L (ref 38–126)
Anion gap: 7 (ref 5–15)
BUN: 8 mg/dL (ref 8–23)
CO2: 28 mmol/L (ref 22–32)
Calcium: 9.3 mg/dL (ref 8.9–10.3)
Chloride: 105 mmol/L (ref 98–111)
Creatinine: 0.77 mg/dL (ref 0.44–1.00)
GFR, Estimated: >60 mL/min (ref >60)
Glucose, Bld: 101 mg/dL — ABNORMAL HIGH (ref 70–99)
Potassium: 4 mmol/L (ref 3.5–5.1)
Sodium: 140 mmol/L (ref 135–145)
Total Bilirubin: 0.5 mg/dL (ref 0.0–1.2)
Total Protein: 7.1 g/dL (ref 6.5–8.1)

## 2023-12-03 MED ORDER — SODIUM CHLORIDE 0.9 % IV SOLN: INTRAVENOUS | Status: DC

## 2023-12-03 MED ORDER — SODIUM CHLORIDE 0.9 % IV SOLN
1500.0000 mg | Freq: Once | INTRAVENOUS | Status: AC
  Administered 2023-12-03: 1500 mg via INTRAVENOUS
  Filled 2023-12-03: qty 30

## 2023-12-03 NOTE — Patient Instructions (Signed)
 [facility]  Discharge Instructions: Thank you for choosing [facility] to provide your oncology and hematology care.   If you have a lab appointment with the Cancer Center, please go directly to the Cancer Center and check in at the registration area.   Wear comfortable clothing and clothing appropriate for easy access to any Portacath or PICC line.   We strive to give you quality time with your provider. You may need to reschedule your appointment if you arrive late (15 or more minutes).  Arriving late affects you and other patients whose appointments are after yours.  Also, if you miss three or more appointments without notifying the office, you may be dismissed from the clinic at the provider's discretion.      For prescription refill requests, have your pharmacy contact our office and allow 72 hours for refills to be completed.    Today you received the following chemotherapy and/or immunotherapy agent: Durvalumab (Imfinzi)      To help prevent nausea and vomiting after your treatment, we encourage you to take your nausea medication as directed.  BELOW ARE SYMPTOMS THAT SHOULD BE REPORTED IMMEDIATELY: *FEVER GREATER THAN 100.4 F (38 C) OR HIGHER *CHILLS OR SWEATING *NAUSEA AND VOMITING THAT IS NOT CONTROLLED WITH YOUR NAUSEA MEDICATION *UNUSUAL SHORTNESS OF BREATH *UNUSUAL BRUISING OR BLEEDING *URINARY PROBLEMS (pain or burning when urinating, or frequent urination) *BOWEL PROBLEMS (unusual diarrhea, constipation, pain near the anus) TENDERNESS IN MOUTH AND THROAT WITH OR WITHOUT PRESENCE OF ULCERS (sore throat, sores in mouth, or a toothache) UNUSUAL RASH, SWELLING OR PAIN  UNUSUAL VAGINAL DISCHARGE OR ITCHING   Items with * indicate a potential emergency and should be followed up as soon as possible or go to the Emergency Department if any problems should occur.  Please show the CHEMOTHERAPY ALERT CARD or  IMMUNOTHERAPY ALERT CARD at check-in to the Emergency Department and triage nurse.  Should you have questions after your visit or need to cancel or reschedule your appointment, please contact [facility]  Dept: [phone]  and follow the prompts.  Office hours are 8:00 a.m. to 4:30 p.m. Monday - Friday. Please note that voicemails left after 4:00 p.m. may not be returned until the following business day.  We are closed weekends and major holidays. You have access to a nurse at all times for urgent questions. Please call the main number to the clinic Dept: [phone] and follow the prompts.   For any non-urgent questions, you may also contact your provider using MyChart. We now offer e-Visits for anyone 52 and older to request care online for non-urgent symptoms. For details visit [email or web address].   Also download the MyChart app! Go to the app store, search "MyChart", open the app, select [place], and log in with your MyChart username and password.  Durvalumab Injection What is this medication? DURVALUMAB (dur VAL ue mab) treats some types of cancer. It works by helping your immune system slow or stop the spread of cancer cells. It is a monoclonal antibody. This medicine may be used for other purposes; ask your health care provider or pharmacist if you have questions. COMMON BRAND NAME(S): IMFINZI What should I tell my care team before I take this medication? They need to know if you have any of these conditions: Allogeneic stem cell transplant (uses someone else's stem cells)  Autoimmune diseases, such as Crohn disease, ulcerative colitis, lupus History of chest radiation Nervous system problems, such as Guillain-Barre syndrome, myasthenia gravis Organ transplant An unusual or allergic reaction to durvalumab, other medications, foods, dyes, or preservatives Pregnant or trying to get pregnant Breast-feeding How should I use  this medication? This medication is infused into a vein. It is given by your care team in a hospital or clinic setting. A special MedGuide will be given to you before each treatment. Be sure to read this information carefully each time. Talk to your care team about the use of this medication in children. Special care may be needed. Overdosage: If you think you have taken too much of this medicine contact a poison control center or emergency room at once. NOTE: This medicine is only for you. Do not share this medicine with others. What if I miss a dose? Keep appointments for follow-up doses. It is important not to miss your dose. Call your care team if you are unable to keep an appointment. What may interact with this medication? Interactions have not been studied. This list may not describe all possible interactions. Give your health care provider a list of all the medicines, herbs, non-prescription drugs, or dietary supplements you use. Also tell them if you smoke, drink alcohol, or use illegal drugs. Some items may interact with your medicine. What should I watch for while using this medication? Your condition will be monitored carefully while you are receiving this medication. You may need blood work while taking this medication. This medication may cause serious skin reactions. They can happen weeks to months after starting the medication. Contact your care team right away if you notice fevers or flu-like symptoms with a rash. The rash may be red or purple and then turn into blisters or peeling of the skin. You may also notice a red rash with swelling of the face, lips, or lymph nodes in your neck or under your arms. Tell your care team right away if you have any change in your eyesight. Talk to your care team if you may be pregnant. Serious birth defects can occur if you take this medication during pregnancy and for 3 months after the last dose. You will need a negative pregnancy test before  starting this medication. Contraception is recommended while taking this medication and for 3 months after the last dose. Your care team can help you find the option that works for you. Do not breastfeed while taking this medication and for 3 months after the last dose. What side effects may I notice from receiving this medication? Side effects that you should report to your care team as soon as possible: Allergic reactions--skin rash, itching, hives, swelling of the face, lips, tongue, or throat Dry cough, shortness of breath or trouble breathing Eye pain, redness, irritation, or discharge with blurry or decreased vision Heart muscle inflammation--unusual weakness or fatigue, shortness of breath, chest pain, fast or irregular heartbeat, dizziness, swelling of the ankles, feet, or hands Hormone gland problems--headache, sensitivity to light, unusual weakness or fatigue, dizziness, fast or irregular heartbeat, increased sensitivity to cold or heat, excessive sweating, constipation, hair loss, increased thirst or amount of urine, tremors or shaking, irritability Infusion reactions--chest pain, shortness of breath or trouble breathing, feeling faint or lightheaded Kidney injury (glomerulonephritis)--decrease in the amount of urine, red or dark brown urine, foamy or bubbly urine, swelling of the ankles, hands, or feet Liver injury--right upper belly pain, loss of appetite, nausea, light-colored stool, dark yellow  or brown urine, yellowing skin or eyes, unusual weakness or fatigue Pain, tingling, or numbness in the hands or feet, muscle weakness, change in vision, confusion or trouble speaking, loss of balance or coordination, trouble walking, seizures Rash, fever, and swollen lymph nodes Redness, blistering, peeling, or loosening of the skin, including inside the mouth Sudden or severe stomach pain, bloody diarrhea, fever, nausea, vomiting Side effects that usually do not require medical attention  (report these to your care team if they continue or are bothersome): Bone, joint, or muscle pain Diarrhea Fatigue Loss of appetite Nausea Skin rash This list may not describe all possible side effects. Call your doctor for medical advice about side effects. You may report side effects to FDA at 1-800-FDA-1088. Where should I keep my medication? This medication is given in a hospital or clinic. It will not be stored at home. NOTE: This sheet is a summary. It may not cover all possible information. If you have questions about this medicine, talk to your doctor, pharmacist, or health care provider.  2024 Elsevier/Gold Standard (2021-09-03 00:00:00)

## 2023-12-30 ENCOUNTER — Inpatient Hospital Stay: Payer: Self-pay

## 2023-12-30 ENCOUNTER — Inpatient Hospital Stay: Payer: Self-pay | Admitting: Internal Medicine

## 2023-12-30 ENCOUNTER — Inpatient Hospital Stay: Payer: Self-pay | Attending: Internal Medicine

## 2023-12-30 VITALS — BP 124/88 | HR 87 | Temp 98.0°F | Resp 17 | Ht 63.0 in | Wt 166.8 lb

## 2023-12-30 DIAGNOSIS — C3412 Malignant neoplasm of upper lobe, left bronchus or lung: Secondary | ICD-10-CM

## 2023-12-30 DIAGNOSIS — Z5112 Encounter for antineoplastic immunotherapy: Secondary | ICD-10-CM | POA: Insufficient documentation

## 2023-12-30 DIAGNOSIS — F17210 Nicotine dependence, cigarettes, uncomplicated: Secondary | ICD-10-CM | POA: Insufficient documentation

## 2023-12-30 LAB — CBC WITH DIFFERENTIAL (CANCER CENTER ONLY)
Abs Immature Granulocytes: 0.01 K/uL (ref 0.00–0.07)
Basophils Absolute: 0.1 K/uL (ref 0.0–0.1)
Basophils Relative: 1 %
Eosinophils Absolute: 0.2 K/uL (ref 0.0–0.5)
Eosinophils Relative: 3 %
HCT: 41.4 % (ref 36.0–46.0)
Hemoglobin: 13.9 g/dL (ref 12.0–15.0)
Immature Granulocytes: 0 %
Lymphocytes Relative: 18 %
Lymphs Abs: 0.9 K/uL (ref 0.7–4.0)
MCH: 29.4 pg (ref 26.0–34.0)
MCHC: 33.6 g/dL (ref 30.0–36.0)
MCV: 87.5 fL (ref 80.0–100.0)
Monocytes Absolute: 0.4 K/uL (ref 0.1–1.0)
Monocytes Relative: 8 %
Neutro Abs: 3.6 K/uL (ref 1.7–7.7)
Neutrophils Relative %: 70 %
Platelet Count: 250 K/uL (ref 150–400)
RBC: 4.73 MIL/uL (ref 3.87–5.11)
RDW: 13 % (ref 11.5–15.5)
WBC Count: 5.2 K/uL (ref 4.0–10.5)
nRBC: 0 % (ref 0.0–0.2)

## 2023-12-30 LAB — CMP (CANCER CENTER ONLY)
ALT: 15 U/L (ref 0–44)
AST: 15 U/L (ref 15–41)
Albumin: 4.1 g/dL (ref 3.5–5.0)
Alkaline Phosphatase: 92 U/L (ref 38–126)
Anion gap: 6 (ref 5–15)
BUN: 8 mg/dL (ref 8–23)
CO2: 29 mmol/L (ref 22–32)
Calcium: 9.4 mg/dL (ref 8.9–10.3)
Chloride: 104 mmol/L (ref 98–111)
Creatinine: 0.81 mg/dL (ref 0.44–1.00)
GFR, Estimated: >60 mL/min (ref >60)
Glucose, Bld: 99 mg/dL (ref 70–99)
Potassium: 3.8 mmol/L (ref 3.5–5.1)
Sodium: 139 mmol/L (ref 135–145)
Total Bilirubin: 0.4 mg/dL (ref 0.0–1.2)
Total Protein: 6.9 g/dL (ref 6.5–8.1)

## 2023-12-30 MED ORDER — SODIUM CHLORIDE 0.9 % IV SOLN
1500.0000 mg | Freq: Once | INTRAVENOUS | Status: AC
  Administered 2023-12-30: 1500 mg via INTRAVENOUS
  Filled 2023-12-30: qty 30

## 2023-12-30 MED ORDER — SODIUM CHLORIDE 0.9 % IV SOLN
INTRAVENOUS | Status: DC
Start: 2023-12-30 — End: 2023-12-30

## 2023-12-30 NOTE — Patient Instructions (Signed)
 [facility] - A DEPT OF [place]  Discharge Instructions: Thank you for choosing [facility] to provide your oncology and hematology care.   If you have a lab appointment with the Cancer Center, please go directly to the Cancer Center and check in at the registration area.   Wear comfortable clothing and clothing appropriate for easy access to any Portacath or PICC line.   We strive to give you quality time with your provider. You may need to reschedule your appointment if you arrive late (15 or more minutes).  Arriving late affects you and other patients whose appointments are after yours.  Also, if you miss three or more appointments without notifying the office, you may be dismissed from the clinic at the provider's discretion.      For prescription refill requests, have your pharmacy contact our office and allow 72 hours for refills to be completed.    Today you received the following chemotherapy and/or immunotherapy agents imfinzi       To help prevent nausea and vomiting after your treatment, we encourage you to take your nausea medication as directed.  BELOW ARE SYMPTOMS THAT SHOULD BE REPORTED IMMEDIATELY: *FEVER GREATER THAN 100.4 F (38 C) OR HIGHER *CHILLS OR SWEATING *NAUSEA AND VOMITING THAT IS NOT CONTROLLED WITH YOUR NAUSEA MEDICATION *UNUSUAL SHORTNESS OF BREATH *UNUSUAL BRUISING OR BLEEDING *URINARY PROBLEMS (pain or burning when urinating, or frequent urination) *BOWEL PROBLEMS (unusual diarrhea, constipation, pain near the anus) TENDERNESS IN MOUTH AND THROAT WITH OR WITHOUT PRESENCE OF ULCERS (sore throat, sores in mouth, or a toothache) UNUSUAL RASH, SWELLING OR PAIN  UNUSUAL VAGINAL DISCHARGE OR ITCHING   Items with * indicate a potential emergency and should be followed up as soon as possible or go to the Emergency Department if any problems should occur.  Please show the CHEMOTHERAPY ALERT CARD or IMMUNOTHERAPY  ALERT CARD at check-in to the Emergency Department and triage nurse.  Should you have questions after your visit or need to cancel or reschedule your appointment, please contact [facility]  Dept: [phone]  and follow the prompts.  Office hours are 8:00 a.m. to 4:30 p.m. Monday - Friday. Please note that voicemails left after 4:00 p.m. may not be returned until the following business day.  We are closed weekends and major holidays. You have access to a nurse at all times for urgent questions. Please call the main number to the clinic Dept: [phone] and follow the prompts.   For any non-urgent questions, you may also contact your provider using MyChart. We now offer e-Visits for anyone 34 and older to request care online for non-urgent symptoms. For details visit [email or web address].   Also download the MyChart app! Go to the app store, search MyChart, open the app, select [place], and log in with your MyChart username and password.

## 2023-12-30 NOTE — Progress Notes (Signed)
 [facility] Telephone:([phone]   Fax:([phone]) [phone]  OFFICE PROGRESS NOTE  Patient, No Pcp Per No address on file  DIAGNOSIS: stage IIIa (T2a, N2, M0) non-small cell lung cancer, with unknown subtype secondary to insufficient tissue presented with left upper lobe lung mass in addition to left hilar and AP window lymphadenopathy diagnosed in October 2024.    PRIOR THERAPY: Concurrent chemoradiation with carboplatin  for AUC of 2 and paclitaxel  45 mg/m2 chemotherapy. Status post 6 cycles. First dose on 03/30/23. Status post 6  cycles.  Last dose was given May 05, 2023.   CURRENT THERAPY: Consolidation treatment with immunotherapy with Imfinzi  1500 Mg IV every 4 weeks.  First dose June 17, 2023.  Status post 7 cycles.  INTERVAL HISTORY: Debra Lutz 63 y.o. female returns to the clinic today for follow-up visit. Discussed the use of AI scribe software for clinical note transcription with the patient, who gave verbal consent to proceed.  History of Present Illness Debra Lutz is a 63 year old female with stage 3A non-small cell lung cancer who presents for evaluation before starting cycle number eight of her treatment.  Diagnosed with stage 3A non-small cell lung cancer in October 2024, she completed concurrent chemoradiation with weekly carboplatin  and paclitaxel . Currently, she is undergoing consolidation treatment with immunotherapy, receiving Mtenzi every four weeks, and is preparing for her eighth cycle.  She experiences a persistent cough, particularly bothersome at night, which she manages with Mucinex and prescribed 'little cough beads'. She also uses a humidifier. She is in the process of quitting smoking, which she believes will help alleviate her symptoms.  No chest pain, hemoptysis, nausea, vomiting, or diarrhea. She recently twisted her right leg, but no further details are provided about this injury.      MEDICAL HISTORY: Past Medical History:   Diagnosis Date   Allergy    Cancer (HCC) 02/2023   lung   COPD (chronic obstructive pulmonary disease) (HCC)    History of radiation therapy    Left Lung SBRT- 03/30/23-05/14/23- Dr. Lynwood Nasuti   Lung nodule 09/2022   hypermetabolic left upper lobe    ALLERGIES:  is allergic to wixela inhub [fluticasone-salmeterol].  MEDICATIONS:  Current Outpatient Medications  Medication Sig Dispense Refill   albuterol (ACCUNEB) 0.63 MG/3ML nebulizer solution Take 1 ampule by nebulization every 6 (six) hours as needed for wheezing.     albuterol (VENTOLIN HFA) 108 (90 Base) MCG/ACT inhaler Inhale into the lungs every 6 (six) hours as needed for wheezing or shortness of breath. Using at night     azithromycin  (ZITHROMAX  Z-PAK) 250 MG tablet Take as directed 6 each 0   benzonatate  (TESSALON ) 100 MG capsule Take 1 capsule (100 mg total) by mouth 3 (three) times daily as needed. 30 capsule 2   doxycycline  (VIBRA -TABS) 100 MG tablet Take 1 tablet (100 mg total) by mouth 2 (two) times daily. 14 tablet 0   fexofenadine (ALLEGRA ALLERGY) 180 MG tablet Take 180 mg by mouth as needed.     HYDROcodone  bit-homatropine (HYCODAN) 5-1.5 MG/5ML syrup Take 5 mLs by mouth every 6 (six) hours as needed for cough. 120 mL 0   ibuprofen (ADVIL) 200 MG tablet Take 200 mg by mouth every 4 (four) hours as needed for mild pain (pain score 1-3).     methylPREDNISolone  (MEDROL  DOSEPAK) 4 MG TBPK tablet Use as instructed 21 tablet 0   Multiple Vitamins-Minerals (MULTIVITAMINS THER. W/MINERALS) TABS tablet Take 1 tablet by mouth daily.  No current facility-administered medications for this visit.    SURGICAL HISTORY:  Past Surgical History:  Procedure Laterality Date   BRONCHIAL NEEDLE ASPIRATION BIOPSY  03/02/2023   Procedure: BRONCHIAL NEEDLE ASPIRATION BIOPSIES;  Surgeon: Brenna Adine CROME, DO;  Location: [facility];  Service: Pulmonary;;   KNEE SURGERY Left 2017   knee repair with hardware   VIDEO BRONCHOSCOPY  WITH ENDOBRONCHIAL ULTRASOUND Left 03/02/2023   Procedure: VIDEO BRONCHOSCOPY WITH ENDOBRONCHIAL ULTRASOUND;  Surgeon: Brenna Adine CROME, DO;  Location: [facility];  Service: Pulmonary;  Laterality: Left;    REVIEW OF SYSTEMS:  A comprehensive review of systems was negative except for: Respiratory: positive for cough   PHYSICAL EXAMINATION: General appearance: alert, cooperative, and no distress Head: Normocephalic, without obvious abnormality, atraumatic Neck: no adenopathy, no JVD, supple, symmetrical, trachea midline, and thyroid  not enlarged, symmetric, no tenderness/mass/nodules Lymph nodes: Cervical, supraclavicular, and axillary nodes normal. Resp: clear to auscultation bilaterally Back: symmetric, no curvature. ROM normal. No CVA tenderness. Cardio: regular rate and rhythm, S1, S2 normal, no murmur, click, rub or gallop GI: soft, non-tender; bowel sounds normal; no masses,  no organomegaly Extremities: extremities normal, atraumatic, no cyanosis or edema  ECOG PERFORMANCE STATUS: 1 - Symptomatic but completely ambulatory  Blood pressure 124/88, pulse 87, temperature 98 F (36.7 C), resp. rate 17, height 5' 3 (1.6 m), weight 166 lb 12.8 oz (75.7 kg), SpO2 97%.  LABORATORY DATA: Lab Results  Component Value Date   WBC 5.2 12/30/2023   HGB 13.9 12/30/2023   HCT 41.4 12/30/2023   MCV 87.5 12/30/2023   PLT 250 12/30/2023      Chemistry      Component Value Date/Time   NA 139 12/30/2023 1009   K 3.8 12/30/2023 1009   CL 104 12/30/2023 1009   CO2 29 12/30/2023 1009   BUN 8 12/30/2023 1009   CREATININE 0.81 12/30/2023 1009      Component Value Date/Time   CALCIUM 9.4 12/30/2023 1009   ALKPHOS 92 12/30/2023 1009   AST 15 12/30/2023 1009   ALT 15 12/30/2023 1009   BILITOT 0.4 12/30/2023 1009       RADIOGRAPHIC STUDIES: No results found.   ASSESSMENT AND PLAN: This is a very pleasant 63 years old white female with stage IIIa (T2a, N2, M0) non-small cell lung  cancer, with unknown subtype secondary to insufficient tissue presented with left upper lobe lung mass in addition to left hilar and AP window lymphadenopathy diagnosed in October 2024.  She underwent a course of concurrent chemoradiation with weekly carboplatin  for AUC of 2 and paclitaxel  45 Mg/M2 status post 6 cycles of treatment.  The patient has been tolerating this treatment fairly well. She is currently on consolidation treatment with immunotherapy with Imfinzi  1500 Mg IV every 4 weeks status post 7 cycles.  She has been tolerating this treatment well with no concerning adverse effects. Assessment and Plan Assessment & Plan Stage 3A non-small cell lung cancer on active treatment Stage 3A non-small cell lung cancer diagnosed in October 2024. Status post concurrent chemoradiation with weekly carboplatin  and paclitaxel . Currently undergoing consolidation treatment with immunotherapy, specifically Mtenzi, every four weeks. She has completed seven cycles and is here for evaluation before starting cycle eight. Lab work is satisfactory for proceeding with treatment. No new symptoms such as chest pain, hemoptysis, nausea, vomiting, or diarrhea reported. - Proceed with cycle number eight of immunotherapy as planned.  Cough Persistent cough, particularly severe at night. Currently managed with Mucinex and prescribed cough beads.  She is also using a humidifier and is in the process of quitting smoking, which is expected to help alleviate the cough. No associated symptoms such as chest pain, increased shortness of breath, or hemoptysis reported. The patient was advised to call immediately if she has any concerning symptoms in the interval.  The patient voices understanding of current disease status and treatment options and is in agreement with the current care plan.  All questions were answered. The patient knows to call the clinic with any problems, questions or concerns. We can certainly see the patient  much sooner if necessary.  Disclaimer: This note was dictated with voice recognition software. Similar sounding words can inadvertently be transcribed and may not be corrected upon review.

## 2024-01-02 ENCOUNTER — Other Ambulatory Visit: Payer: Self-pay

## 2024-01-03 ENCOUNTER — Other Ambulatory Visit: Payer: Self-pay

## 2024-01-21 NOTE — Progress Notes (Signed)
 [facility] OFFICE PROGRESS NOTE  Patient, No Pcp Per No address on file  DIAGNOSIS: Stage IIIa (T2a, N2, M0) non-small cell lung cancer, with unknown subtype secondary to insufficient tissue presented with left upper lobe lung mass in addition to left hilar and AP window lymphadenopathy diagnosed in October 2024.   PRIOR THERAPY: Concurrent chemoradiation with carboplatin  for AUC of 2 and paclitaxel  45 mg/m2 chemotherapy. Status post 6 cycles. First dose on 03/30/23. Status post 6 cycles. Last dose was given May 05, 2023.   CURRENT THERAPY: Consolidation treatment with immunotherapy with Imfinzi  1500 Mg IV every 4 weeks. First dose June 17, 2023. Status post 8 cycle.   INTERVAL HISTORY: Debra Lutz 63 y.o. female returns to the clinic today for a follow-up. She was last seen in the clinic 1 month ago by Dr. Sherrod. She is currently undergoing consolidation immunotherapy with Imfinzi  which she is tolerating well.   She states she is feeling the same today.   She has similar chronic cough for several months. She tried hycodan and Robitussin which are not effective. She also tried tessalon . She used honey, lemon, and salt and it was somewhat effective. She has a history of smoking and finds it challenging to quit due to her household environment where everyone smokes. She is trying to cut back. She has nebulizer and inhalers at home.  She uses mucinex. Her shortness of breath is stable. She denies fevers, chest pain, or hemoptysis. Denies any nausea, vomiting, diarrhea, or constipation.  Denies any rashes or skin changes. She is here today for evaluation and repeat blood work before undergoing cycle #9   MEDICAL HISTORY: Past Medical History:  Diagnosis Date   Allergy    Cancer (HCC) 02/2023   lung   COPD (chronic obstructive pulmonary disease) (HCC)    History of radiation therapy    Left Lung SBRT- 03/30/23-05/14/23- Dr. Lynwood Nasuti   Lung nodule 09/2022    hypermetabolic left upper lobe    ALLERGIES:  is allergic to wixela inhub [fluticasone-salmeterol].  MEDICATIONS:  Current Outpatient Medications  Medication Sig Dispense Refill   albuterol (ACCUNEB) 0.63 MG/3ML nebulizer solution Take 1 ampule by nebulization every 6 (six) hours as needed for wheezing.     albuterol (VENTOLIN HFA) 108 (90 Base) MCG/ACT inhaler Inhale into the lungs every 6 (six) hours as needed for wheezing or shortness of breath. Using at night     azithromycin  (ZITHROMAX  Z-PAK) 250 MG tablet Take as directed 6 each 0   benzonatate  (TESSALON ) 100 MG capsule Take 1 capsule (100 mg total) by mouth 3 (three) times daily as needed. 30 capsule 2   doxycycline  (VIBRA -TABS) 100 MG tablet Take 1 tablet (100 mg total) by mouth 2 (two) times daily. 14 tablet 0   fexofenadine (ALLEGRA ALLERGY) 180 MG tablet Take 180 mg by mouth as needed.     HYDROcodone  bit-homatropine (HYCODAN) 5-1.5 MG/5ML syrup Take 5 mLs by mouth every 6 (six) hours as needed for cough. 120 mL 0   ibuprofen (ADVIL) 200 MG tablet Take 200 mg by mouth every 4 (four) hours as needed for mild pain (pain score 1-3).     methylPREDNISolone  (MEDROL  DOSEPAK) 4 MG TBPK tablet Use as instructed 21 tablet 0   Multiple Vitamins-Minerals (MULTIVITAMINS THER. W/MINERALS) TABS tablet Take 1 tablet by mouth daily.     No current facility-administered medications for this visit.    SURGICAL HISTORY:  Past Surgical History:  Procedure Laterality Date   BRONCHIAL NEEDLE  ASPIRATION BIOPSY  03/02/2023   Procedure: BRONCHIAL NEEDLE ASPIRATION BIOPSIES;  Surgeon: Brenna Adine CROME, DO;  Location: [facility];  Service: Pulmonary;;   KNEE SURGERY Left 2017   knee repair with hardware   VIDEO BRONCHOSCOPY WITH ENDOBRONCHIAL ULTRASOUND Left 03/02/2023   Procedure: VIDEO BRONCHOSCOPY WITH ENDOBRONCHIAL ULTRASOUND;  Surgeon: Brenna Adine CROME, DO;  Location: [facility];  Service: Pulmonary;  Laterality: Left;    REVIEW OF SYSTEMS:    Review of Systems  Constitutional: Positive for stable fatigue. Negative for appetite change, chills,  fever and unexpected weight change.  HENT: Negative for mouth sores, nosebleeds, sore throat and trouble swallowing.   Eyes: Negative for eye problems and icterus.  Respiratory: Positive for stable cough, wheezing, and intermittent dyspnea on exertion. Negative for hemoptysis.  Cardiovascular: Negative for chest pain and leg swelling.  Gastrointestinal: Negative for abdominal pain, constipation, diarrhea, nausea and vomiting.  Genitourinary: Negative for bladder incontinence, difficulty urinating, dysuria, frequency and hematuria.   Musculoskeletal: Negative for back pain, gait problem, neck pain and neck stiffness.  Skin: Negative for itching and rash.  Neurological: Negative for dizziness, extremity weakness, gait problem, headaches, light-headedness and seizures.  Hematological: Negative for adenopathy. Does not bruise/bleed easily.  Psychiatric/Behavioral: Negative for confusion, depression and sleep disturbance. The patient is not nervous/anxious.     PHYSICAL EXAMINATION:  There were no vitals taken for this visit.  ECOG PERFORMANCE STATUS: 1  Physical Exam  Constitutional: Oriented to person, place, and time and well-developed, well-nourished, and in no distress.  HENT:  Head: Normocephalic and atraumatic.  Mouth/Throat: Oropharynx is clear and moist. No oropharyngeal exudate.  Eyes: Conjunctivae are normal. Right eye exhibits no discharge. Left eye exhibits no discharge. No scleral icterus.  Neck: Normal range of motion. Neck supple.  Cardiovascular: Normal rate, regular rhythm, normal heart sounds and intact distal pulses.   Pulmonary/Chest: Effort normal. Some mild wheezing/rhonchi noted bilaterally. No respiratory distress. No rales.  Abdominal: Soft. Bowel sounds are normal. Exhibits no distension and no mass. There is no tenderness.  Musculoskeletal: Normal range of  motion. Exhibits no edema.  Lymphadenopathy:    No cervical adenopathy.  Neurological: Alert and oriented to person, place, and time. Exhibits normal muscle tone. Gait normal. Coordination normal.  Skin: Skin is warm and dry. No rash noted. Not diaphoretic. No erythema. No pallor.  Psychiatric: Mood, memory and judgment normal.  Vitals reviewed.   LABORATORY DATA: Lab Results  Component Value Date   WBC 5.2 12/30/2023   HGB 13.9 12/30/2023   HCT 41.4 12/30/2023   MCV 87.5 12/30/2023   PLT 250 12/30/2023      Chemistry      Component Value Date/Time   NA 139 12/30/2023 1009   K 3.8 12/30/2023 1009   CL 104 12/30/2023 1009   CO2 29 12/30/2023 1009   BUN 8 12/30/2023 1009   CREATININE 0.81 12/30/2023 1009      Component Value Date/Time   CALCIUM 9.4 12/30/2023 1009   ALKPHOS 92 12/30/2023 1009   AST 15 12/30/2023 1009   ALT 15 12/30/2023 1009   BILITOT 0.4 12/30/2023 1009       RADIOGRAPHIC STUDIES:  No results found.   ASSESSMENT/PLAN:  This is a very pleasant 63 year old Caucasian female diagnosed with stage IIIa (T2a, N2, M0) non-small cell lung cancer, with unknown subtype secondary to insufficient tissue presented with left upper lobe lung mass in addition to left hilar and AP window lymphadenopathy diagnosed in October 2024.    She  underwent a course of concurrent chemoradiation with weekly carboplatin  for AUC of 2 and paclitaxel  45 Mg/M2 status post 6 cycles of treatment.    He is currently undergoing consolidation immunotherapy with Imfinzi  1500 mg IV every 4 weeks.  She is status post her 8th cycle of treatment which was given on 06/17/23.    Labs were reviewed.  Recommend that she proceed with cycle #9 today scheduled.   We will see her back in 4 weeks for labs and follow up before undergoing cycle #10.   I will arrange for a restaging CT scan of the CAP prior to the next cycle of treatment.    Chronic cough with wheezing Persistent cough with wheezing,  unchanged. Smoking likely exacerbates symptoms. - Continue Mucinex and nebulizer treatments. - Advise on smoking cessation, including patches and gum. -If her cough changes, then she knows she can get her scan sooner. Overall, she states the cough is the same.   The patient was advised to call immediately if she has any concerning symptoms in the interval. The patient voices understanding of current disease status and treatment options and is in agreement with the current care plan. All questions were answered. The patient knows to call the clinic with any problems, questions or concerns. We can certainly see the patient much sooner if necessary      No orders of the defined types were placed in this encounter.    The total time spent in the appointment was 20-29 minutes  Madison Albea L Marcelline Temkin, PA-C 01/21/24

## 2024-01-27 ENCOUNTER — Inpatient Hospital Stay: Payer: Self-pay | Attending: Internal Medicine

## 2024-01-27 ENCOUNTER — Inpatient Hospital Stay: Payer: Self-pay | Admitting: Physician Assistant

## 2024-01-27 ENCOUNTER — Inpatient Hospital Stay: Payer: Self-pay

## 2024-01-27 VITALS — BP 121/80 | HR 80 | Temp 98.0°F | Resp 14 | Wt 165.5 lb

## 2024-01-27 DIAGNOSIS — C3412 Malignant neoplasm of upper lobe, left bronchus or lung: Secondary | ICD-10-CM | POA: Insufficient documentation

## 2024-01-27 DIAGNOSIS — F17210 Nicotine dependence, cigarettes, uncomplicated: Secondary | ICD-10-CM | POA: Insufficient documentation

## 2024-01-27 DIAGNOSIS — Z79620 Long term (current) use of immunosuppressive biologic: Secondary | ICD-10-CM | POA: Insufficient documentation

## 2024-01-27 DIAGNOSIS — Z5112 Encounter for antineoplastic immunotherapy: Secondary | ICD-10-CM

## 2024-01-27 LAB — CMP (CANCER CENTER ONLY)
ALT: 14 U/L (ref 0–44)
AST: 17 U/L (ref 15–41)
Albumin: 4.3 g/dL (ref 3.5–5.0)
Alkaline Phosphatase: 86 U/L (ref 38–126)
Anion gap: 6 (ref 5–15)
BUN: 7 mg/dL — ABNORMAL LOW (ref 8–23)
CO2: 29 mmol/L (ref 22–32)
Calcium: 9.6 mg/dL (ref 8.9–10.3)
Chloride: 105 mmol/L (ref 98–111)
Creatinine: 0.75 mg/dL (ref 0.44–1.00)
GFR, Estimated: >60 mL/min (ref >60)
Glucose, Bld: 93 mg/dL (ref 70–99)
Potassium: 3.5 mmol/L (ref 3.5–5.1)
Sodium: 140 mmol/L (ref 135–145)
Total Bilirubin: 0.5 mg/dL (ref 0.0–1.2)
Total Protein: 7.3 g/dL (ref 6.5–8.1)

## 2024-01-27 LAB — CBC WITH DIFFERENTIAL (CANCER CENTER ONLY)
Abs Immature Granulocytes: 0.01 K/uL (ref 0.00–0.07)
Basophils Absolute: 0.1 K/uL (ref 0.0–0.1)
Basophils Relative: 1 %
Eosinophils Absolute: 0.2 K/uL (ref 0.0–0.5)
Eosinophils Relative: 3 %
HCT: 40.9 % (ref 36.0–46.0)
Hemoglobin: 13.7 g/dL (ref 12.0–15.0)
Immature Granulocytes: 0 %
Lymphocytes Relative: 22 %
Lymphs Abs: 1 K/uL (ref 0.7–4.0)
MCH: 29.2 pg (ref 26.0–34.0)
MCHC: 33.5 g/dL (ref 30.0–36.0)
MCV: 87.2 fL (ref 80.0–100.0)
Monocytes Absolute: 0.4 K/uL (ref 0.1–1.0)
Monocytes Relative: 9 %
Neutro Abs: 3 K/uL (ref 1.7–7.7)
Neutrophils Relative %: 65 %
Platelet Count: 253 K/uL (ref 150–400)
RBC: 4.69 MIL/uL (ref 3.87–5.11)
RDW: 13.1 % (ref 11.5–15.5)
WBC Count: 4.7 K/uL (ref 4.0–10.5)
nRBC: 0 % (ref 0.0–0.2)

## 2024-01-27 LAB — TSH: TSH: 2.3 u[IU]/mL (ref 0.350–4.500)

## 2024-01-27 MED ORDER — SODIUM CHLORIDE 0.9 % IV SOLN: INTRAVENOUS | Status: DC

## 2024-01-27 MED ORDER — SODIUM CHLORIDE 0.9 % IV SOLN
1500.0000 mg | Freq: Once | INTRAVENOUS | Status: AC
  Administered 2024-01-27: 1500 mg via INTRAVENOUS
  Filled 2024-01-27: qty 30

## 2024-01-27 NOTE — Patient Instructions (Signed)
 [facility] - A DEPT OF [place]  Discharge Instructions: Thank you for choosing [facility] to provide your oncology and hematology care.   If you have a lab appointment with the Cancer Center, please go directly to the Cancer Center and check in at the registration area.   Wear comfortable clothing and clothing appropriate for easy access to any Portacath or PICC line.   We strive to give you quality time with your provider. You may need to reschedule your appointment if you arrive late (15 or more minutes).  Arriving late affects you and other patients whose appointments are after yours.  Also, if you miss three or more appointments without notifying the office, you may be dismissed from the clinic at the provider's discretion.      For prescription refill requests, have your pharmacy contact our office and allow 72 hours for refills to be completed.    Today you received the following chemotherapy and/or immunotherapy agents imfinzi       To help prevent nausea and vomiting after your treatment, we encourage you to take your nausea medication as directed.  BELOW ARE SYMPTOMS THAT SHOULD BE REPORTED IMMEDIATELY: *FEVER GREATER THAN 100.4 F (38 C) OR HIGHER *CHILLS OR SWEATING *NAUSEA AND VOMITING THAT IS NOT CONTROLLED WITH YOUR NAUSEA MEDICATION *UNUSUAL SHORTNESS OF BREATH *UNUSUAL BRUISING OR BLEEDING *URINARY PROBLEMS (pain or burning when urinating, or frequent urination) *BOWEL PROBLEMS (unusual diarrhea, constipation, pain near the anus) TENDERNESS IN MOUTH AND THROAT WITH OR WITHOUT PRESENCE OF ULCERS (sore throat, sores in mouth, or a toothache) UNUSUAL RASH, SWELLING OR PAIN  UNUSUAL VAGINAL DISCHARGE OR ITCHING   Items with * indicate a potential emergency and should be followed up as soon as possible or go to the Emergency Department if any problems should occur.  Please show the CHEMOTHERAPY ALERT CARD or IMMUNOTHERAPY  ALERT CARD at check-in to the Emergency Department and triage nurse.  Should you have questions after your visit or need to cancel or reschedule your appointment, please contact [facility]  Dept: [phone]  and follow the prompts.  Office hours are 8:00 a.m. to 4:30 p.m. Monday - Friday. Please note that voicemails left after 4:00 p.m. may not be returned until the following business day.  We are closed weekends and major holidays. You have access to a nurse at all times for urgent questions. Please call the main number to the clinic Dept: [phone] and follow the prompts.   For any non-urgent questions, you may also contact your provider using MyChart. We now offer e-Visits for anyone 34 and older to request care online for non-urgent symptoms. For details visit [email or web address].   Also download the MyChart app! Go to the app store, search MyChart, open the app, select [place], and log in with your MyChart username and password.

## 2024-01-28 LAB — T4: T4, Total: 8.9 ug/dL (ref 4.5–12.0)

## 2024-01-29 ENCOUNTER — Other Ambulatory Visit: Payer: Self-pay

## 2024-01-31 ENCOUNTER — Other Ambulatory Visit: Payer: Self-pay

## 2024-02-15 ENCOUNTER — Ambulatory Visit (HOSPITAL_COMMUNITY)
Admission: RE | Admit: 2024-02-15 | Discharge: 2024-02-15 | Disposition: A | Discharge status: Home / Self Care | Payer: Self-pay | Source: Ambulatory Visit | Attending: Physician Assistant | Admitting: Physician Assistant

## 2024-02-15 DIAGNOSIS — C3412 Malignant neoplasm of upper lobe, left bronchus or lung: Secondary | ICD-10-CM | POA: Insufficient documentation

## 2024-02-15 MED ORDER — IOHEXOL 300 MG/ML  SOLN
75.0000 mL | Freq: Once | INTRAMUSCULAR | Status: AC | PRN
  Administered 2024-02-15: 75 mL via INTRAVENOUS

## 2024-02-15 MED ORDER — IOPAMIDOL (ISOVUE-300) INJECTION 61%: 75.0000 mL | Freq: Once | INTRAVENOUS | Status: DC | PRN

## 2024-02-16 ENCOUNTER — Encounter: Payer: Self-pay | Admitting: Internal Medicine

## 2024-02-24 ENCOUNTER — Telehealth: Payer: Self-pay | Admitting: *Deleted

## 2024-02-24 ENCOUNTER — Inpatient Hospital Stay: Payer: Self-pay

## 2024-02-24 ENCOUNTER — Inpatient Hospital Stay: Payer: Self-pay | Attending: Internal Medicine

## 2024-02-24 ENCOUNTER — Inpatient Hospital Stay (HOSPITAL_BASED_OUTPATIENT_CLINIC_OR_DEPARTMENT_OTHER): Payer: Self-pay | Admitting: Internal Medicine

## 2024-02-24 VITALS — BP 105/65 | HR 88 | Temp 98.2°F | Resp 17 | Ht 63.0 in | Wt 162.0 lb

## 2024-02-24 DIAGNOSIS — Z5112 Encounter for antineoplastic immunotherapy: Secondary | ICD-10-CM | POA: Insufficient documentation

## 2024-02-24 DIAGNOSIS — C3490 Malignant neoplasm of unspecified part of unspecified bronchus or lung: Secondary | ICD-10-CM

## 2024-02-24 DIAGNOSIS — E876 Hypokalemia: Secondary | ICD-10-CM | POA: Insufficient documentation

## 2024-02-24 DIAGNOSIS — C3412 Malignant neoplasm of upper lobe, left bronchus or lung: Secondary | ICD-10-CM

## 2024-02-24 DIAGNOSIS — F17210 Nicotine dependence, cigarettes, uncomplicated: Secondary | ICD-10-CM | POA: Insufficient documentation

## 2024-02-24 DIAGNOSIS — R112 Nausea with vomiting, unspecified: Secondary | ICD-10-CM | POA: Insufficient documentation

## 2024-02-24 LAB — CBC WITH DIFFERENTIAL (CANCER CENTER ONLY)
Abs Immature Granulocytes: 0.02 K/uL (ref 0.00–0.07)
Basophils Absolute: 0 K/uL (ref 0.0–0.1)
Basophils Relative: 1 %
Eosinophils Absolute: 0.1 K/uL (ref 0.0–0.5)
Eosinophils Relative: 3 %
HCT: 38.8 % (ref 36.0–46.0)
Hemoglobin: 12.9 g/dL (ref 12.0–15.0)
Immature Granulocytes: 0 %
Lymphocytes Relative: 19 %
Lymphs Abs: 1 K/uL (ref 0.7–4.0)
MCH: 28.8 pg (ref 26.0–34.0)
MCHC: 33.2 g/dL (ref 30.0–36.0)
MCV: 86.6 fL (ref 80.0–100.0)
Monocytes Absolute: 0.5 K/uL (ref 0.1–1.0)
Monocytes Relative: 9 %
Neutro Abs: 3.5 K/uL (ref 1.7–7.7)
Neutrophils Relative %: 68 %
Platelet Count: 268 K/uL (ref 150–400)
RBC: 4.48 MIL/uL (ref 3.87–5.11)
RDW: 12.2 % (ref 11.5–15.5)
WBC Count: 5.1 K/uL (ref 4.0–10.5)
nRBC: 0 % (ref 0.0–0.2)

## 2024-02-24 LAB — CMP (CANCER CENTER ONLY)
ALT: 7 U/L (ref 0–44)
AST: 10 U/L — ABNORMAL LOW (ref 15–41)
Albumin: 3.9 g/dL (ref 3.5–5.0)
Alkaline Phosphatase: 84 U/L (ref 38–126)
Anion gap: 8 (ref 5–15)
BUN: 8 mg/dL (ref 8–23)
CO2: 28 mmol/L (ref 22–32)
Calcium: 9.7 mg/dL (ref 8.9–10.3)
Chloride: 103 mmol/L (ref 98–111)
Creatinine: 0.72 mg/dL (ref 0.44–1.00)
GFR, Estimated: >60 mL/min (ref >60)
Glucose, Bld: 96 mg/dL (ref 70–99)
Potassium: 3.3 mmol/L — ABNORMAL LOW (ref 3.5–5.1)
Sodium: 139 mmol/L (ref 135–145)
Total Bilirubin: 0.5 mg/dL (ref 0.0–1.2)
Total Protein: 7 g/dL (ref 6.5–8.1)

## 2024-02-24 MED ORDER — SODIUM CHLORIDE 0.9 % IV SOLN
1500.0000 mg | Freq: Once | INTRAVENOUS | Status: AC
  Administered 2024-02-24: 1500 mg via INTRAVENOUS
  Filled 2024-02-24: qty 30

## 2024-02-24 MED ORDER — SODIUM CHLORIDE 0.9 % IV SOLN: INTRAVENOUS | Status: DC

## 2024-02-24 MED ORDER — HYDROCODONE BIT-HOMATROP MBR 5-1.5 MG/5ML PO SOLN: 5.0000 mL | Freq: Four times a day (QID) | ORAL | 0 refills | Status: AC | PRN

## 2024-02-24 NOTE — Progress Notes (Signed)
 [facility] Telephone:([phone]) [phone]   Fax:([phone]) [phone]  OFFICE PROGRESS NOTE  Patient, No Pcp Per No address on file  DIAGNOSIS: stage IIIa (T2a, N2, M0) non-small cell lung cancer, with unknown subtype secondary to insufficient tissue presented with left upper lobe lung mass in addition to left hilar and AP window lymphadenopathy diagnosed in October 2024.    PRIOR THERAPY: Concurrent chemoradiation with carboplatin  for AUC of 2 and paclitaxel  45 mg/m2 chemotherapy. Status post 6 cycles. First dose on 03/30/23. Status post 6  cycles.  Last dose was given May 05, 2023.   CURRENT THERAPY: Consolidation treatment with immunotherapy with Imfinzi  1500 Mg IV every 4 weeks.  First dose June 17, 2023.  Status post 9 cycles.  INTERVAL HISTORY: Debra Lutz 63 y.o. Lutz returns to the clinic today for follow-up visit. Discussed the use of AI scribe software for clinical note transcription with the patient, who gave verbal consent to proceed.  History of Present Illness Debra Lutz is a 63 year old Lutz with stage 3A non-small cell lung cancer who presents for evaluation and to start cycle number ten of immunotherapy.  Diagnosed with stage 3A non-small cell lung cancer in October 2024, she completed concurrent chemoradiation and is undergoing consolidation treatment with durvalumab  every four weeks.  She experiences a 'junky' cough, primarily non-productive, and pain on her left side and back. Over-the-counter medications like Mucinex and Delsym have not alleviated her symptoms. During a recent emergency room visit, blood tests were normal. She was prescribed amoxicillin but has not started it, preferring to consult first.  The pain disrupts her sleep, and she inquires about using ibuprofen or Tylenol for pain management. She lives in [place]  and uses a pharmacy in [place] for prescriptions. No recent flu or COVID-19 infections.     MEDICAL HISTORY: Past Medical  History:  Diagnosis Date   Allergy    Cancer (HCC) 02/2023   lung   COPD (chronic obstructive pulmonary disease) (HCC)    History of radiation therapy    Left Lung SBRT- 03/30/23-05/14/23- Dr. Lynwood Nasuti   Lung nodule 09/2022   hypermetabolic left upper lobe    ALLERGIES:  is allergic to wixela inhub [fluticasone-salmeterol].  MEDICATIONS:  Current Outpatient Medications  Medication Sig Dispense Refill   albuterol (ACCUNEB) 0.63 MG/3ML nebulizer solution Take 1 ampule by nebulization every 6 (six) hours as needed for wheezing.     albuterol (VENTOLIN HFA) 108 (90 Base) MCG/ACT inhaler Inhale into the lungs every 6 (six) hours as needed for wheezing or shortness of breath. Using at night     azithromycin  (ZITHROMAX  Z-PAK) 250 MG tablet Take as directed 6 each 0   benzonatate  (TESSALON ) 100 MG capsule Take 1 capsule (100 mg total) by mouth 3 (three) times daily as needed. 30 capsule 2   doxycycline  (VIBRA -TABS) 100 MG tablet Take 1 tablet (100 mg total) by mouth 2 (two) times daily. 14 tablet 0   fexofenadine (ALLEGRA ALLERGY) 180 MG tablet Take 180 mg by mouth as needed.     HYDROcodone  bit-homatropine (HYCODAN) 5-1.5 MG/5ML syrup Take 5 mLs by mouth every 6 (six) hours as needed for cough. 120 mL 0   ibuprofen (ADVIL) 200 MG tablet Take 200 mg by mouth every 4 (four) hours as needed for mild pain (pain score 1-3).     methylPREDNISolone  (MEDROL  DOSEPAK) 4 MG TBPK tablet Use as instructed 21 tablet 0   Multiple Vitamins-Minerals (MULTIVITAMINS THER. W/MINERALS) TABS tablet Take 1  tablet by mouth daily.     No current facility-administered medications for this visit.    SURGICAL HISTORY:  Past Surgical History:  Procedure Laterality Date   BRONCHIAL NEEDLE ASPIRATION BIOPSY  03/02/2023   Procedure: BRONCHIAL NEEDLE ASPIRATION BIOPSIES;  Surgeon: Brenna Adine CROME, DO;  Location: [facility];  Service: Pulmonary;;   KNEE SURGERY Left 2017   knee repair with hardware   VIDEO  BRONCHOSCOPY WITH ENDOBRONCHIAL ULTRASOUND Left 03/02/2023   Procedure: VIDEO BRONCHOSCOPY WITH ENDOBRONCHIAL ULTRASOUND;  Surgeon: Brenna Adine CROME, DO;  Location: [facility];  Service: Pulmonary;  Laterality: Left;    REVIEW OF SYSTEMS:  Constitutional: positive for fatigue Eyes: negative Ears, nose, mouth, throat, and face: negative Respiratory: positive for cough and dyspnea on exertion Cardiovascular: negative Gastrointestinal: negative Genitourinary:negative Integument/breast: negative Hematologic/lymphatic: negative Musculoskeletal:negative Neurological: negative Behavioral/Psych: negative Endocrine: negative Allergic/Immunologic: negative   PHYSICAL EXAMINATION: General appearance: alert, cooperative, fatigued, and no distress Head: Normocephalic, without obvious abnormality, atraumatic Neck: no adenopathy, no JVD, supple, symmetrical, trachea midline, and thyroid  not enlarged, symmetric, no tenderness/mass/nodules Lymph nodes: Cervical, supraclavicular, and axillary nodes normal. Resp: clear to auscultation bilaterally Back: symmetric, no curvature. ROM normal. No CVA tenderness. Cardio: regular rate and rhythm, S1, S2 normal, no murmur, click, rub or gallop GI: soft, non-tender; bowel sounds normal; no masses,  no organomegaly Extremities: extremities normal, atraumatic, no cyanosis or edema Neurologic: Alert and oriented X 3, normal strength and tone. Normal symmetric reflexes. Normal coordination and gait  ECOG PERFORMANCE STATUS: 1 - Symptomatic but completely ambulatory  Blood pressure 105/65, pulse 88, temperature 98.2 F (36.8 C), resp. rate 17, height 5' 3 (1.6 m), weight 162 lb (73.5 kg), SpO2 100%.  LABORATORY DATA: Lab Results  Component Value Date   WBC 5.1 02/24/2024   HGB 12.9 02/24/2024   HCT 38.8 02/24/2024   MCV 86.6 02/24/2024   PLT 268 02/24/2024      Chemistry      Component Value Date/Time   NA 139 02/24/2024 1025   K 3.3 (L)  02/24/2024 1025   CL 103 02/24/2024 1025   CO2 28 02/24/2024 1025   BUN 8 02/24/2024 1025   CREATININE 0.72 02/24/2024 1025      Component Value Date/Time   CALCIUM 9.7 02/24/2024 1025   ALKPHOS 84 02/24/2024 1025   AST 10 (L) 02/24/2024 1025   ALT 7 02/24/2024 1025   BILITOT 0.5 02/24/2024 1025       RADIOGRAPHIC STUDIES: CT Chest W Contrast Result Date: 02/15/2024 CLINICAL DATA:  Non-small cell lung cancer (NSCLC), non-metastatic, assess treatment response. * Tracking Code: BO * EXAM: CT CHEST WITH CONTRAST TECHNIQUE: Multidetector CT imaging of the chest was performed during intravenous contrast administration. RADIATION DOSE REDUCTION: This exam was performed according to the departmental dose-optimization program which includes automated exposure control, adjustment of the mA and/or kV according to patient size and/or use of iterative reconstruction technique. CONTRAST:  75mL OMNIPAQUE  IOHEXOL  300 MG/ML  SOLN COMPARISON:  CT scan chest from 11/05/2023. FINDINGS: Cardiovascular: Normal cardiac size. Stable small pericardial effusion. No aortic aneurysm. There are coronary artery calcifications, in keeping with coronary artery disease. There are also mild peripheral atherosclerotic vascular calcifications of thoracic aorta and its major branches. Mediastinum/Nodes: Visualized thyroid  gland appears grossly unremarkable. No solid / cystic mediastinal masses. The esophagus is nondistended precluding optimal assessment. No axillary, mediastinal or hilar lymphadenopathy by size criteria. Lungs/Pleura: The central tracheo-bronchial tree is patent. Redemonstration of previously seen irregular areas of scarring in the left  upper lobe. However, there is continued increasing opacification currently measuring up to 1.6 x 5.6 cm, which has significantly increased since the prior study. There are also new peripheral heterogeneous centrilobular nodules in the anterior segment of left upper lobe which may  represent postobstructive pneumonia. There are mild centrilobular emphysematous changes with upper lobe predominance. Rest of the lungs are grossly clear. No mass, consolidation or lung collapse. No pleural effusion. No suspicious lung nodule. Upper Abdomen: Mild diffuse hepatic steatosis. Remaining visualized upper abdominal viscera within normal limits. Musculoskeletal: The visualized soft tissues of the chest wall are grossly unremarkable. No suspicious osseous lesions. There are mild multilevel degenerative changes in the visualized spine. IMPRESSION: 1. There is continued increasing opacification in the left upper lobe, which is nonspecific. Differential diagnosis include posttreatment changes, superimposed pneumonia or recurrent tumor. Further evaluation with PET-CT scan is recommended after appropriate therapy, as indicated. 2. Multiple other nonacute observations, as described above. Electronically Signed   By: Ree Molt M.D.   On: 02/15/2024 08:51     ASSESSMENT AND PLAN: This is a very pleasant 63 years old white Lutz with stage IIIa (T2a, N2, M0) non-small cell lung cancer, with unknown subtype secondary to insufficient tissue presented with left upper lobe lung mass in addition to left hilar and AP window lymphadenopathy diagnosed in October 2024.  She underwent a course of concurrent chemoradiation with weekly carboplatin  for AUC of 2 and paclitaxel  45 Mg/M2 status post 6 cycles of treatment.  The patient has been tolerating this treatment fairly well. She is currently on consolidation treatment with immunotherapy with Imfinzi  1500 Mg IV every 4 weeks status post 9 cycles.  She has been tolerating this treatment well with no concerning adverse effects. Assessment and Plan Assessment & Plan Stage 3A non-small cell lung cancer, post-chemoradiation, on immunotherapy Stage 3A non-small cell lung cancer, diagnosed in October 2024, currently undergoing consolidation treatment with durvalumab   every four weeks. Completed nine cycles and is here for evaluation and to start cycle ten. Recent CT scan shows post-radiation changes, but but disease recurrence could not be excluded. PET scan planned to assess for tumor progression or scarring. 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 Recent CT scan shows post-radiation changes. PET scan planned to differentiate between tumor progression and scarring. - Order PET scan to differentiate between tumor progression and scarring  Cough and left-sided chest pain under evaluation Cough and left-sided chest pain, possibly related to post-radiation changes or inflammation from immunotherapy. Recent ER visit with normal blood tests. Cough described as junky, with pain affecting sleep. Amoxicillin prescribed by ER, not yet taken. Plan to use cough medicine with narcotic for symptom relief. - Instruct to take amoxicillin as prescribed by ER - Prescribe cough medicine with narcotic (hydrocodone ) to be taken every six hours as needed - Advise use of Tylenol for pain management if needed - Order PET scan to assess for any concerning findings related to cough and pain She was advised to call immediately if she has any other concerning symptoms in the interval. The patient voices understanding of current disease status and treatment options and is in agreement with the current care plan.  All questions were answered. The patient knows to call the clinic with any problems, questions or concerns. We can certainly see the patient much sooner if necessary.  Disclaimer: This note  was dictated with voice recognition software. Similar sounding words can inadvertently be transcribed and may not be corrected upon review.

## 2024-02-24 NOTE — Patient Instructions (Signed)
 [facility] - A DEPT OF [place]  Discharge Instructions: Thank you for choosing [facility] to provide your oncology and hematology care.   If you have a lab appointment with the Cancer Center, please go directly to the Cancer Center and check in at the registration area.   Wear comfortable clothing and clothing appropriate for easy access to any Portacath or PICC line.   We strive to give you quality time with your provider. You may need to reschedule your appointment if you arrive late (15 or more minutes).  Arriving late affects you and other patients whose appointments are after yours.  Also, if you miss three or more appointments without notifying the office, you may be dismissed from the clinic at the provider's discretion.      For prescription refill requests, have your pharmacy contact our office and allow 72 hours for refills to be completed.    Today you received the following chemotherapy and/or immunotherapy agents imfinzi       To help prevent nausea and vomiting after your treatment, we encourage you to take your nausea medication as directed.  BELOW ARE SYMPTOMS THAT SHOULD BE REPORTED IMMEDIATELY: *FEVER GREATER THAN 100.4 F (38 C) OR HIGHER *CHILLS OR SWEATING *NAUSEA AND VOMITING THAT IS NOT CONTROLLED WITH YOUR NAUSEA MEDICATION *UNUSUAL SHORTNESS OF BREATH *UNUSUAL BRUISING OR BLEEDING *URINARY PROBLEMS (pain or burning when urinating, or frequent urination) *BOWEL PROBLEMS (unusual diarrhea, constipation, pain near the anus) TENDERNESS IN MOUTH AND THROAT WITH OR WITHOUT PRESENCE OF ULCERS (sore throat, sores in mouth, or a toothache) UNUSUAL RASH, SWELLING OR PAIN  UNUSUAL VAGINAL DISCHARGE OR ITCHING   Items with * indicate a potential emergency and should be followed up as soon as possible or go to the Emergency Department if any problems should occur.  Please show the CHEMOTHERAPY ALERT CARD or IMMUNOTHERAPY  ALERT CARD at check-in to the Emergency Department and triage nurse.  Should you have questions after your visit or need to cancel or reschedule your appointment, please contact [facility]  Dept: [phone]  and follow the prompts.  Office hours are 8:00 a.m. to 4:30 p.m. Monday - Friday. Please note that voicemails left after 4:00 p.m. may not be returned until the following business day.  We are closed weekends and major holidays. You have access to a nurse at all times for urgent questions. Please call the main number to the clinic Dept: [phone] and follow the prompts.   For any non-urgent questions, you may also contact your provider using MyChart. We now offer e-Visits for anyone 34 and older to request care online for non-urgent symptoms. For details visit [email or web address].   Also download the MyChart app! Go to the app store, search MyChart, open the app, select [place], and log in with your MyChart username and password.

## 2024-02-24 NOTE — Telephone Encounter (Signed)
 PC to patient, no answer, left VM - informed her of Cassie's recommendation below.  Asked that patient contact this office to let us  know if she wants rx for Remeron.  Also informed her she needs to schedule appointment with pulmonologist.  Awaiting call back.

## 2024-02-24 NOTE — Telephone Encounter (Signed)
-----   Message from Cassandra L Heilingoetter sent at 02/24/2024  2:03 PM EDT ----- Regarding: Appetite Jason relayed that the patient was reporting decreased appetite. I don't think she is a candidate for steroids due to her immunotherapy. I would offer her Remeron which is an antidepressant but if she takes at night it can help her sleep and can increase appetite the longer you are on it. Can you see if she is interested in starting this? If so, I can send a prescription. Also, If she does not already have a pulmonologist, she should have one. I believe she saw Dr. Brenna in the past and likely is already a patient of that office and needs to call to establish with one of the providers in that office

## 2024-02-25 ENCOUNTER — Other Ambulatory Visit: Payer: Self-pay

## 2024-02-25 ENCOUNTER — Other Ambulatory Visit: Payer: Self-pay | Admitting: Physician Assistant

## 2024-02-25 DIAGNOSIS — R112 Nausea with vomiting, unspecified: Secondary | ICD-10-CM

## 2024-02-25 DIAGNOSIS — C3490 Malignant neoplasm of unspecified part of unspecified bronchus or lung: Secondary | ICD-10-CM

## 2024-02-25 DIAGNOSIS — R059 Cough, unspecified: Secondary | ICD-10-CM

## 2024-02-25 MED ORDER — PROMETHAZINE-DM 6.25-15 MG/5ML PO SYRP: 5.0000 mL | ORAL_SOLUTION | Freq: Four times a day (QID) | ORAL | 0 refills | Status: DC | PRN

## 2024-02-25 MED ORDER — MIRTAZAPINE 15 MG PO TABS: 15.0000 mg | ORAL_TABLET | Freq: Every day | ORAL | 2 refills | Status: AC

## 2024-02-26 ENCOUNTER — Inpatient Hospital Stay: Payer: Self-pay | Admitting: Physician Assistant

## 2024-02-26 ENCOUNTER — Inpatient Hospital Stay: Payer: Self-pay

## 2024-02-26 VITALS — BP 109/62 | HR 86 | Temp 98.0°F | Resp 14 | Wt 160.3 lb

## 2024-02-26 VITALS — BP 109/62 | HR 86 | Resp 14

## 2024-02-26 DIAGNOSIS — R112 Nausea with vomiting, unspecified: Secondary | ICD-10-CM

## 2024-02-26 DIAGNOSIS — C3490 Malignant neoplasm of unspecified part of unspecified bronchus or lung: Secondary | ICD-10-CM

## 2024-02-26 DIAGNOSIS — E876 Hypokalemia: Secondary | ICD-10-CM

## 2024-02-26 LAB — CMP (CANCER CENTER ONLY)
ALT: 6 U/L (ref 0–44)
AST: 10 U/L — ABNORMAL LOW (ref 15–41)
Albumin: 3.8 g/dL (ref 3.5–5.0)
Alkaline Phosphatase: 79 U/L (ref 38–126)
Anion gap: 7 (ref 5–15)
BUN: 8 mg/dL (ref 8–23)
CO2: 30 mmol/L (ref 22–32)
Calcium: 9.4 mg/dL (ref 8.9–10.3)
Chloride: 102 mmol/L (ref 98–111)
Creatinine: 0.75 mg/dL (ref 0.44–1.00)
GFR, Estimated: >60 mL/min (ref >60)
Glucose, Bld: 123 mg/dL — ABNORMAL HIGH (ref 70–99)
Potassium: 3.3 mmol/L — ABNORMAL LOW (ref 3.5–5.1)
Sodium: 139 mmol/L (ref 135–145)
Total Bilirubin: 0.5 mg/dL (ref 0.0–1.2)
Total Protein: 7.1 g/dL (ref 6.5–8.1)

## 2024-02-26 LAB — MAGNESIUM: Magnesium: 2 mg/dL (ref 1.7–2.4)

## 2024-02-26 LAB — CBC WITH DIFFERENTIAL (CANCER CENTER ONLY)
Abs Immature Granulocytes: 0.01 K/uL (ref 0.00–0.07)
Basophils Absolute: 0 K/uL (ref 0.0–0.1)
Basophils Relative: 1 %
Eosinophils Absolute: 0.1 K/uL (ref 0.0–0.5)
Eosinophils Relative: 3 %
HCT: 37.6 % (ref 36.0–46.0)
Hemoglobin: 12.6 g/dL (ref 12.0–15.0)
Immature Granulocytes: 0 %
Lymphocytes Relative: 19 %
Lymphs Abs: 0.8 K/uL (ref 0.7–4.0)
MCH: 28.8 pg (ref 26.0–34.0)
MCHC: 33.5 g/dL (ref 30.0–36.0)
MCV: 85.8 fL (ref 80.0–100.0)
Monocytes Absolute: 0.4 K/uL (ref 0.1–1.0)
Monocytes Relative: 9 %
Neutro Abs: 2.9 K/uL (ref 1.7–7.7)
Neutrophils Relative %: 68 %
Platelet Count: 284 K/uL (ref 150–400)
RBC: 4.38 MIL/uL (ref 3.87–5.11)
RDW: 12.2 % (ref 11.5–15.5)
WBC Count: 4.3 K/uL (ref 4.0–10.5)
nRBC: 0 % (ref 0.0–0.2)

## 2024-02-26 MED ORDER — SODIUM CHLORIDE 0.9 % IV SOLN: INTRAVENOUS | Status: DC

## 2024-02-26 MED ORDER — POTASSIUM CHLORIDE 10 MEQ/100ML IV SOLN
10.0000 meq | Freq: Once | INTRAVENOUS | Status: AC
  Administered 2024-02-26: 10 meq via INTRAVENOUS
  Filled 2024-02-26: qty 100

## 2024-02-26 MED ORDER — ONDANSETRON 4 MG PO TBDP: 4.0000 mg | ORAL_TABLET | Freq: Three times a day (TID) | ORAL | 0 refills | Status: AC | PRN

## 2024-02-26 NOTE — Progress Notes (Signed)
 Symptom Management Consult Note [facility]    Patient Care Team: Patient, No Pcp Per as PCP - General (General Practice) Bouchard, Duwaine BROCKS, RN as Oncology Nurse Navigator    Name / MRN / DOB: Debra Lutz  [id number]  1960-08-06   Date of visit: 02/26/2024   Chief Complaint/Reason for visit: nausea and vomiting   Current Therapy: : Consolidation treatment with immunotherapy with Imfinzi  1500 Mg IV every 4 weeks   Last treatment:  Day 1   Cycle 10 on 02/24/24    ASSESSMENT AND PLAN Patient is a 63 y.o. female with oncologic history of stage IIIa (T2a, N2, M0) non-small cell lung cancer followed by Dr. Sherrod.  I have viewed most recent oncology note and lab work.  #Stage IIIa (T2a, N2, M0) non-small cell lung cancer - Next appointment with oncologist is 03/23/24   #Nausea and vomiting  Discussed AE of treatment with patient including constipation, decreased appetite, nausea, vomiting. - Patient is well appearing. Afebrile, HDS. Abdominal exam is benign.  - CBC unremarkable. CMP with mild hypokalemia 3.3. Patient with poor PO intake and 1 day of multiple episodes of emesis. Will administer 10 mEq of potassium in clinic. Discussed increasing dietary sources with potassium. - Patient denies nausea in clinic and was able to tolerate PO intake. Will send zofran  ODT for PRN use. 1L NS administered In clinic for hydration support. - Discussed OTC constipation management.  Strict ED precautions discussed should symptoms worsen.   HEME/ONC HISTORY Oncology History  Primary cancer of left upper lobe of lung (HCC)  03/18/2023 Initial Diagnosis   Primary cancer of left upper lobe of lung (HCC)   03/18/2023 Cancer Staging   Staging form: Lung, AJCC 8th Edition - Clinical stage from 03/18/2023: Stage IIIA (cT2a, cN2, cM0) - Signed by Shannon Agent, MD on 03/18/2023 Histopathologic type: Carcinoma, NOS Stage prefix: Initial diagnosis   03/31/2023 - 05/05/2023  Chemotherapy   Patient is on Treatment Plan : LUNG Carboplatin  + Paclitaxel  + XRT q7d     06/17/2023 -  Chemotherapy   Patient is on Treatment Plan : LUNG NSCLC Durvalumab  (1500) q28d         INTERVAL HISTORY  Discussed the use of AI scribe software for clinical note transcription with the patient, who gave verbal consent to proceed.    Debra Lutz is a 62 y.o. female with oncologic history as above presenting to [facility] today with chief complaint of nausea and vomiting. Patient presents unaccompanied to visit today.  She has experienced significant decrease in appetite and poor oral intake that started after her treatment in September. Her intake over the past few days has been minimal, including a small portion of a sub sandwich, a banana, four crackers, two bites of a quesadilla, and three bites of a peanut butter sandwich. She reports no abdominal pain.  She recently started on Remeron as an appetite stimulant, with the first dose taken yesterday. She is also on Augmentin x 2 days and is tolerating it well. She experienced vomiting three times yesterday after taking a dose of cough medicine, which she believes was due to taking it on an empty stomach. No current nausea is reported, and she does not have any nausea medication at home.  Her fluid intake has decreased, with only two cups of coffee and two bottles of water consumed yesterday, attributing the decrease to feeling unwell. No diarrhea is reported, but she is experiencing constipation, for which she has been  taking Miralax in her morning coffee without achieving a bowel movement. She acknowledges that her low food intake may be contributing to her constipation.     ROS  All other systems are reviewed and are negative for acute change except as noted in the HPI.    Allergies  Allergen Reactions   Wixela Inhub [Fluticasone-Salmeterol] Other (See Comments)    Having tightness in chest ,joint pain , loss of appetite      Past  Medical History:  Diagnosis Date   Allergy    Cancer (HCC) 02/2023   lung   COPD (chronic obstructive pulmonary disease) (HCC)    History of radiation therapy    Left Lung SBRT- 03/30/23-05/14/23- Dr. Lynwood Nasuti   Lung nodule 09/2022   hypermetabolic left upper lobe     Past Surgical History:  Procedure Laterality Date   BRONCHIAL NEEDLE ASPIRATION BIOPSY  03/02/2023   Procedure: BRONCHIAL NEEDLE ASPIRATION BIOPSIES;  Surgeon: Brenna Adine CROME, DO;  Location: [facility];  Service: Pulmonary;;   KNEE SURGERY Left 2017   knee repair with hardware   VIDEO BRONCHOSCOPY WITH ENDOBRONCHIAL ULTRASOUND Left 03/02/2023   Procedure: VIDEO BRONCHOSCOPY WITH ENDOBRONCHIAL ULTRASOUND;  Surgeon: Brenna Adine CROME, DO;  Location: [facility];  Service: Pulmonary;  Laterality: Left;    Social History   Socioeconomic History   Marital status: Married    Spouse name: Not on file   Number of children: Not on file   Years of education: Not on file   Highest education level: Not on file  Occupational History   Not on file  Tobacco Use   Smoking status: Every Day    Current packs/day: 1.00    Types: Cigarettes   Smokeless tobacco: Not on file  Vaping Use   Vaping status: Never Used  Substance and Sexual Activity   Alcohol use: No   Drug use: Not Currently    Types: Marijuana    Comment: instructed to withhold 1-2 days prior to procedure.   Sexual activity: Not on file  Other Topics Concern   Not on file  Social History Narrative   Not on file   Social Drivers of Health   Financial Resource Strain: Not on file  Food Insecurity: No Food Insecurity (03/18/2023)   Hunger Vital Sign    Worried About Running Out of Food in the Last Year: Never true    Ran Out of Food in the Last Year: Never true  Transportation Needs: No Transportation Needs (03/18/2023)   PRAPARE - Administrator, Civil Service (Medical): No    Lack of Transportation (Non-Medical): No  Physical  Activity: Not on file  Stress: Not on file  Social Connections: Not on file  Intimate Partner Violence: Not At Risk (03/18/2023)   Humiliation, Afraid, Rape, and Kick questionnaire    Fear of Current or Ex-Partner: No    Emotionally Abused: No    Physically Abused: No    Sexually Abused: No    Family History  Problem Relation Age of Onset   Bladder Cancer Father    Cancer Paternal Uncle    Colon cancer Neg Hx    Rectal cancer Neg Hx    Stomach cancer Neg Hx    Esophageal cancer Neg Hx      Current Outpatient Medications:    ondansetron  (ZOFRAN -ODT) 4 MG disintegrating tablet, Take 1 tablet (4 mg total) by mouth every 8 (eight) hours as needed for nausea or vomiting., Disp: 20 tablet, Rfl: 0  albuterol (ACCUNEB) 0.63 MG/3ML nebulizer solution, Take 1 ampule by nebulization every 6 (six) hours as needed for wheezing., Disp: , Rfl:    albuterol (VENTOLIN HFA) 108 (90 Base) MCG/ACT inhaler, Inhale into the lungs every 6 (six) hours as needed for wheezing or shortness of breath. Using at night, Disp: , Rfl:    amoxicillin-clavulanate (AUGMENTIN) 875-125 MG tablet, Take 1 tablet by mouth 2 (two) times daily., Disp: , Rfl:    azithromycin  (ZITHROMAX  Z-PAK) 250 MG tablet, Take as directed (Patient not taking: Reported on 02/26/2024), Disp: 6 each, Rfl: 0   benzonatate  (TESSALON ) 100 MG capsule, Take 1 capsule (100 mg total) by mouth 3 (three) times daily as needed., Disp: 30 capsule, Rfl: 2   doxycycline  (VIBRA -TABS) 100 MG tablet, Take 1 tablet (100 mg total) by mouth 2 (two) times daily. (Patient not taking: Reported on 02/26/2024), Disp: 14 tablet, Rfl: 0   fexofenadine (ALLEGRA ALLERGY) 180 MG tablet, Take 180 mg by mouth as needed., Disp: , Rfl:    HYDROcodone  bit-homatropine (HYCODAN) 5-1.5 MG/5ML syrup, Take 5 mLs by mouth every 6 (six) hours as needed for cough. (Patient not taking: Reported on 02/26/2024), Disp: 120 mL, Rfl: 0   ibuprofen (ADVIL) 200 MG tablet, Take 200 mg by mouth  every 4 (four) hours as needed for mild pain (pain score 1-3)., Disp: , Rfl:    methylPREDNISolone  (MEDROL  DOSEPAK) 4 MG TBPK tablet, Use as instructed (Patient not taking: Reported on 02/26/2024), Disp: 21 tablet, Rfl: 0   mirtazapine (REMERON) 15 MG tablet, Take 1 tablet (15 mg total) by mouth at bedtime., Disp: 30 tablet, Rfl: 2   Multiple Vitamins-Minerals (MULTIVITAMINS THER. W/MINERALS) TABS tablet, Take 1 tablet by mouth daily., Disp: , Rfl:    promethazine-dextromethorphan (PROMETHAZINE-DM) 6.25-15 MG/5ML syrup, Take 5 mLs by mouth 4 (four) times daily as needed for cough., Disp: 118 mL, Rfl: 0 No current facility-administered medications for this visit.  Facility-Administered Medications Ordered in Other Visits:    0.9 %  sodium chloride  infusion, , Intravenous, Continuous, Walisiewicz, Daviel Allegretto E, PA-C, Stopped at 02/26/24 1436  PHYSICAL EXAM ECOG FS:1 - Symptomatic but completely ambulatory    Vitals:   02/26/24 1157 02/26/24 1438  BP: 114/81 109/62  Pulse: 85 86  Resp: 15 14  Temp: 98 F (36.7 C)   TempSrc: Temporal   SpO2: 99% 98%  Weight: 160 lb 4.8 oz (72.7 kg)    Physical Exam Vitals and nursing note reviewed.  Constitutional:      Appearance: She is not ill-appearing or toxic-appearing.  HENT:     Head: Normocephalic.     Mouth/Throat:     Mouth: Mucous membranes are dry.  Eyes:     Conjunctiva/sclera: Conjunctivae normal.  Cardiovascular:     Rate and Rhythm: Normal rate and regular rhythm.     Pulses: Normal pulses.     Heart sounds: Normal heart sounds.  Pulmonary:     Effort: Pulmonary effort is normal.     Breath sounds: Normal breath sounds.  Abdominal:     General: There is no distension.     Palpations: Abdomen is soft.     Tenderness: There is no abdominal tenderness. There is no guarding.  Musculoskeletal:     Cervical back: Normal range of motion.  Skin:    General: Skin is warm and dry.  Neurological:     Mental Status: She is alert.         LABORATORY DATA I have reviewed the data as  listed    Latest Ref Rng & Units 02/26/2024   11:34 AM 02/24/2024   10:25 AM 01/27/2024   10:41 AM  CBC  WBC 4.0 - 10.5 K/uL 4.3  5.1  4.7   Hemoglobin 12.0 - 15.0 g/dL 87.3  87.0  86.2   Hematocrit 36.0 - 46.0 % 37.6  38.8  40.9   Platelets 150 - 400 K/uL 284  268  253         Latest Ref Rng & Units 02/26/2024   11:34 AM 02/24/2024   10:25 AM 01/27/2024   10:41 AM  CMP  Glucose 70 - 99 mg/dL 876  96  93   BUN 8 - 23 mg/dL 8  8  7    Creatinine 0.44 - 1.00 mg/dL 9.24  9.27  9.24   Sodium 135 - 145 mmol/L 139  139  140   Potassium 3.5 - 5.1 mmol/L 3.3  3.3  3.5   Chloride 98 - 111 mmol/L 102  103  105   CO2 22 - 32 mmol/L 30  28  29    Calcium 8.9 - 10.3 mg/dL 9.4  9.7  9.6   Total Protein 6.5 - 8.1 g/dL 7.1  7.0  7.3   Total Bilirubin 0.0 - 1.2 mg/dL 0.5  0.5  0.5   Alkaline Phos 38 - 126 U/L 79  84  86   AST 15 - 41 U/L 10  10  17    ALT 0 - 44 U/L 6  7  14         RADIOGRAPHIC STUDIES (from last 24 hours if applicable) I have personally reviewed the radiological images as listed and agreed with the findings in the report. No results found.      Visit Diagnosis: 1. Hypokalemia   2. Malignant neoplasm of unspecified part of unspecified bronchus or lung (HCC)   3. Nausea and vomiting, unspecified vomiting type      No orders of the defined types were placed in this encounter.   All questions were answered. The patient knows to call the clinic with any problems, questions or concerns. No barriers to learning was detected.  A total of more than 30 minutes were spent on this encounter with face-to-face time and non-face-to-face time, including preparing to see the patient, ordering tests and/or medications, counseling the patient and coordination of care as outlined above.    Thank you for allowing me to participate in the care of this patient.    Pedro Whiters E  Walisiewicz, PA-C Department of  Hematology/Oncology [facility] at [facility] [place] Phone: [phone]  [phone]    02/26/2024 3:30 PM

## 2024-02-26 NOTE — Patient Instructions (Signed)

## 2024-03-02 ENCOUNTER — Other Ambulatory Visit: Payer: Self-pay

## 2024-03-02 ENCOUNTER — Ambulatory Visit (HOSPITAL_COMMUNITY)
Admission: RE | Admit: 2024-03-02 | Discharge: 2024-03-02 | Disposition: A | Discharge status: Home / Self Care | Payer: Self-pay | Source: Ambulatory Visit | Attending: Internal Medicine | Admitting: Internal Medicine

## 2024-03-02 DIAGNOSIS — C3490 Malignant neoplasm of unspecified part of unspecified bronchus or lung: Secondary | ICD-10-CM | POA: Insufficient documentation

## 2024-03-02 LAB — GLUCOSE, CAPILLARY: Glucose-Capillary: 113 mg/dL — ABNORMAL HIGH (ref 70–99)

## 2024-03-02 MED ORDER — FLUDEOXYGLUCOSE F - 18 (FDG) INJECTION
8.0000 | Freq: Once | INTRAVENOUS | Status: AC
  Administered 2024-03-02: 7.95 via INTRAVENOUS

## 2024-03-23 ENCOUNTER — Inpatient Hospital Stay: Payer: Self-pay | Attending: Internal Medicine

## 2024-03-23 ENCOUNTER — Inpatient Hospital Stay: Payer: Self-pay | Admitting: Internal Medicine

## 2024-03-23 ENCOUNTER — Inpatient Hospital Stay: Payer: Self-pay

## 2024-03-23 VITALS — BP 91/66 | HR 99 | Temp 97.8°F | Resp 17 | Ht 63.0 in | Wt 156.0 lb

## 2024-03-23 DIAGNOSIS — Z79899 Other long term (current) drug therapy: Secondary | ICD-10-CM | POA: Insufficient documentation

## 2024-03-23 DIAGNOSIS — C3490 Malignant neoplasm of unspecified part of unspecified bronchus or lung: Secondary | ICD-10-CM

## 2024-03-23 DIAGNOSIS — C3412 Malignant neoplasm of upper lobe, left bronchus or lung: Secondary | ICD-10-CM | POA: Insufficient documentation

## 2024-03-23 DIAGNOSIS — R059 Cough, unspecified: Secondary | ICD-10-CM | POA: Insufficient documentation

## 2024-03-23 DIAGNOSIS — F17210 Nicotine dependence, cigarettes, uncomplicated: Secondary | ICD-10-CM | POA: Insufficient documentation

## 2024-03-23 LAB — CMP (CANCER CENTER ONLY)
ALT: 9 U/L (ref 0–44)
AST: 17 U/L (ref 15–41)
Albumin: 3.8 g/dL (ref 3.5–5.0)
Alkaline Phosphatase: 78 U/L (ref 38–126)
Anion gap: 12 (ref 5–15)
BUN: 6 mg/dL — ABNORMAL LOW (ref 8–23)
CO2: 26 mmol/L (ref 22–32)
Calcium: 9.7 mg/dL (ref 8.9–10.3)
Chloride: 101 mmol/L (ref 98–111)
Creatinine: 0.75 mg/dL (ref 0.44–1.00)
GFR, Estimated: >60 mL/min (ref >60)
Glucose, Bld: 132 mg/dL — ABNORMAL HIGH (ref 70–99)
Potassium: 3 mmol/L — ABNORMAL LOW (ref 3.5–5.1)
Sodium: 139 mmol/L (ref 135–145)
Total Bilirubin: 0.5 mg/dL (ref 0.0–1.2)
Total Protein: 6.5 g/dL (ref 6.5–8.1)

## 2024-03-23 LAB — CBC WITH DIFFERENTIAL (CANCER CENTER ONLY)
Abs Immature Granulocytes: 0.04 K/uL (ref 0.00–0.07)
Basophils Absolute: 0.1 K/uL (ref 0.0–0.1)
Basophils Relative: 1 %
Eosinophils Absolute: 0.2 K/uL (ref 0.0–0.5)
Eosinophils Relative: 4 %
HCT: 37.4 % (ref 36.0–46.0)
Hemoglobin: 12.1 g/dL (ref 12.0–15.0)
Immature Granulocytes: 1 %
Lymphocytes Relative: 30 %
Lymphs Abs: 1.1 K/uL (ref 0.7–4.0)
MCH: 27.5 pg (ref 26.0–34.0)
MCHC: 32.4 g/dL (ref 30.0–36.0)
MCV: 85 fL (ref 80.0–100.0)
Monocytes Absolute: 0.4 K/uL (ref 0.1–1.0)
Monocytes Relative: 9 %
Neutro Abs: 2.1 K/uL (ref 1.7–7.7)
Neutrophils Relative %: 55 %
Platelet Count: 274 K/uL (ref 150–400)
RBC: 4.4 MIL/uL (ref 3.87–5.11)
RDW: 12.8 % (ref 11.5–15.5)
WBC Count: 3.8 K/uL — ABNORMAL LOW (ref 4.0–10.5)
nRBC: 0 % (ref 0.0–0.2)

## 2024-03-23 MED ORDER — POTASSIUM CHLORIDE CRYS ER 20 MEQ PO TBCR: 20.0000 meq | EXTENDED_RELEASE_TABLET | Freq: Every day | ORAL | 0 refills | Status: DC

## 2024-03-23 MED ORDER — PREDNISONE 10 MG PO TABS: ORAL_TABLET | ORAL | 0 refills | Status: DC

## 2024-03-23 MED ORDER — DOXYCYCLINE HYCLATE 100 MG PO TABS: 100.0000 mg | ORAL_TABLET | Freq: Two times a day (BID) | ORAL | 0 refills | Status: DC

## 2024-03-23 NOTE — Progress Notes (Signed)
 [facility] [phone]   [phone]  OFFICE PROGRESS NOTE  Patient, No Pcp Per No address on file  DIAGNOSIS: stage IIIa (T2a, N2, M0) non-small cell lung cancer, with unknown subtype secondary to insufficient tissue presented with left upper lobe lung mass in addition to left hilar and AP window lymphadenopathy diagnosed in October 2024.    PRIOR THERAPY:  1) Concurrent chemoradiation with carboplatin  for AUC of 2 and paclitaxel  45 mg/m2 chemotherapy. Status post 6 cycles. First dose on 03/30/23. Status post 6  cycles.  Last dose was given May 05, 2023. 2) Consolidation treatment with immunotherapy with Imfinzi  1500 Mg IV every 4 weeks.  First dose June 17, 2023.  Status post 10 cycles.  Last dose was given February 24, 2024.  This treatment was discontinued secondary to suspicious immunotherapy mediated pneumonitis.   CURRENT THERAPY: Observation.  INTERVAL HISTORY: Debra Lutz 63 y.o. female returns to the clinic today for follow-up visit. Discussed the use of AI scribe software for clinical note transcription with the patient, who gave verbal consent to proceed.  History of Present Illness A 63 year old female with stage 3A non-small cell lung cancer presents for evaluation and discussion of recent PET scan results.  Diagnosed with stage 3A non-small cell lung cancer in October 2024, she completed concurrent chemoradiation and is currently on consolidation treatment with durvalumab , receiving 1500 mg IV every four weeks, having completed 10 cycles.  Recent imaging studies showed progressive consolidation in the left upper lobe, and a PET scan was performed to further evaluate the findings.  She reports no new complaints since her last visit, though she experiences some pain and potential sleep disturbances. Her cough remains unchanged and dry, with no worsening in breathing. Her current weight is 156 pounds (70.8 kg).  She uses [facility] in [place] for her prescriptions.  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    MEDICAL HISTORY: Past Medical History:  Diagnosis Date   Allergy    Cancer (HCC) 02/2023   lung   COPD (chronic obstructive pulmonary disease) (HCC)    History of radiation therapy    Left Lung SBRT- 03/30/23-05/14/23- Dr. Lynwood Nasuti   Lung nodule 09/2022   hypermetabolic left upper lobe    ALLERGIES:  is allergic to wixela inhub [fluticasone-salmeterol].  MEDICATIONS:  Current Outpatient Medications  Medication Sig Dispense Refill   albuterol (ACCUNEB) 0.63 MG/3ML nebulizer solution Take 1 ampule by nebulization every 6 (six) hours as needed for wheezing.     albuterol (VENTOLIN HFA) 108 (90 Base) MCG/ACT inhaler Inhale into the lungs every 6 (six) hours as needed for wheezing or shortness of breath. Using at night     azithromycin  (ZITHROMAX  Z-PAK) 250 MG tablet Take as directed (Patient not taking: Reported on 02/26/2024) 6 each 0   benzonatate  (TESSALON ) 100 MG capsule Take 1 capsule (100 mg total) by mouth 3 (three) times daily as needed. 30 capsule 2   doxycycline  (VIBRA -TABS) 100 MG tablet Take 1 tablet (100 mg total) by mouth 2 (two) times daily. (Patient not taking: Reported on 02/26/2024) 14 tablet 0   fexofenadine (ALLEGRA ALLERGY) 180 MG tablet Take 180 mg by mouth as needed.     HYDROcodone  bit-homatropine (HYCODAN) 5-1.5 MG/5ML syrup Take 5 mLs by mouth every 6 (six) hours as needed for cough. (Patient not taking: Reported on 02/26/2024) 120 mL 0   ibuprofen (ADVIL) 200 MG  tablet Take 200 mg by mouth every 4 (four) hours as needed for mild pain (pain score 1-3).     methylPREDNISolone  (MEDROL  DOSEPAK) 4 MG TBPK tablet Use as instructed (Patient not taking: Reported on 02/26/2024) 21 tablet 0   mirtazapine (REMERON) 15 MG tablet Take 1 tablet (15 mg total) by mouth at bedtime. 30 tablet 2    Multiple Vitamins-Minerals (MULTIVITAMINS THER. W/MINERALS) TABS tablet Take 1 tablet by mouth daily.     ondansetron  (ZOFRAN -ODT) 4 MG disintegrating tablet Take 1 tablet (4 mg total) by mouth every 8 (eight) hours as needed for nausea or vomiting. 20 tablet 0   promethazine-dextromethorphan (PROMETHAZINE-DM) 6.25-15 MG/5ML syrup Take 5 mLs by mouth 4 (four) times daily as needed for cough. 118 mL 0   No current facility-administered medications for this visit.    SURGICAL HISTORY:  Past Surgical History:  Procedure Laterality Date   BRONCHIAL NEEDLE ASPIRATION BIOPSY  03/02/2023   Procedure: BRONCHIAL NEEDLE ASPIRATION BIOPSIES;  Surgeon: Brenna Adine CROME, DO;  Location: [facility];  Service: Pulmonary;;   KNEE SURGERY Left 2017   knee repair with hardware   VIDEO BRONCHOSCOPY WITH ENDOBRONCHIAL ULTRASOUND Left 03/02/2023   Procedure: VIDEO BRONCHOSCOPY WITH ENDOBRONCHIAL ULTRASOUND;  Surgeon: Brenna Adine CROME, DO;  Location: [facility];  Service: Pulmonary;  Laterality: Left;    REVIEW OF SYSTEMS:  Constitutional: positive for fatigue Eyes: negative Ears, nose, mouth, throat, and face: negative Respiratory: positive for cough and dyspnea on exertion Cardiovascular: negative Gastrointestinal: negative Genitourinary:negative Integument/breast: negative Hematologic/lymphatic: negative Musculoskeletal:negative Neurological: negative Behavioral/Psych: negative Endocrine: negative Allergic/Immunologic: negative   PHYSICAL EXAMINATION: General appearance: alert, cooperative, fatigued, and no distress Head: Normocephalic, without obvious abnormality, atraumatic Neck: no adenopathy, no JVD, supple, symmetrical, trachea midline, and thyroid  not enlarged, symmetric, no tenderness/mass/nodules Lymph nodes: Cervical, supraclavicular, and axillary nodes normal. Resp: clear to auscultation bilaterally Back: symmetric, no curvature. ROM normal. No CVA tenderness. Cardio: regular rate  and rhythm, S1, S2 normal, no murmur, click, rub or gallop GI: soft, non-tender; bowel sounds normal; no masses,  no organomegaly Extremities: extremities normal, atraumatic, no cyanosis or edema Neurologic: Alert and oriented X 3, normal strength and tone. Normal symmetric reflexes. Normal coordination and gait  ECOG PERFORMANCE STATUS: 1 - Symptomatic but completely ambulatory  Blood pressure 91/66, pulse 99, temperature 97.8 F (36.6 C), temperature source Temporal, resp. rate 17, height 5' 3 (1.6 m), weight 156 lb (70.8 kg), SpO2 100%.  LABORATORY DATA: Lab Results  Component Value Date   WBC 4.3 02/26/2024   HGB 12.6 02/26/2024   HCT 37.6 02/26/2024   MCV 85.8 02/26/2024   PLT 284 02/26/2024      Chemistry      Component Value Date/Time   NA 139 02/26/2024 1134   K 3.3 (L) 02/26/2024 1134   CL 102 02/26/2024 1134   CO2 30 02/26/2024 1134   BUN 8 02/26/2024 1134   CREATININE 0.75 02/26/2024 1134      Component Value Date/Time   CALCIUM 9.4 02/26/2024 1134   ALKPHOS 79 02/26/2024 1134   AST 10 (L) 02/26/2024 1134   ALT 6 02/26/2024 1134   BILITOT 0.5 02/26/2024 1134       RADIOGRAPHIC STUDIES: NM PET Image Restage (PS) Skull Base to Thigh (F-18 FDG) Result Date: 03/03/2024 CLINICAL DATA:  Subsequent treatment strategy for non-small cell lung cancer of left upper lobe status post treatment, evaluate for progressive disease. EXAM: NUCLEAR MEDICINE PET SKULL BASE TO THIGH TECHNIQUE: 7.95 mCi F-18 FDG was  injected intravenously. Full-ring PET imaging was performed from the skull base to thigh after the radiotracer. CT data was obtained and used for attenuation correction and anatomic localization. Fasting blood glucose: 113 mg/dl COMPARISON:  Chest CT February 15, 2024, June 10, 2023. (PET-CT images from February 05, 2023 is not available for comparison). FINDINGS: Mediastinal blood pool activity: SUV max 1.9 Liver activity: SUV max 3.5 NECK: Hypermetabolic left level 2  lymph node measuring 1 cm in short axis max SUV 2.9. No supraclavicular lymphadenopathy. CHEST: Heterogeneous and diffuse patchy FDG avid airspace consolidations/mass involving the left upper lobe max SUV 8 extending from the left hilum toward the periphery. There is narrowing of the left upper lobe bronchus with volume loss and air bronchogram partially due to posttreatment changes. Recurrent malignancy can not be excluded. Metabolic activity and pleural thickening along the anterior aspect of left upper lobe raises the concern for neoplastic involvement. Subcentimeter left hilar lymph nodes can not be excluded on current noncontrast attenuation correction CT and possibly present max SUV 6.3. Above findings are similar to recent chest CT and gradually increasing compared to prior chest CT from June 10, 2023. Trace pericardial effusion.  Small hiatal hernia. ABDOMEN/PELVIS: No suspicious findings to suggest visceral metastatic or nodal disease. Incidental CT findings: Cholelithiasis. SKELETON: No suspicious lytic or sclerotic osseous lesion. Degenerative changes of the spine. Incidental CT findings: None. IMPRESSION: Patchy hypermetabolic left upper lobe infiltration/consolidations extending from the hilum to anterior pleural surface, site of known treated disease concerning for progressive malignancy with or without superimposed infection/inflammation and postobstructive pneumonitis. Left anterior pleural involvement can not be excluded. findings are stable to recent CT and gradually progressive to 2016, considered suspicious until proven otherwise. Partial obstruction of left upper lobe bronchus with suggestion of ipsilateral left hilar lymphadenopathy, likely metastatic. (N2) No supraclavicular lymphadenopathy or extra thoracic FDG avid findings to suggest distant metastatic disease. Left level 2 cervical hypermetabolic lymph node with mild FDG uptake, indeterminate likely reactive. Electronically Signed   By:  Megan  Zare M.D.   On: 03/03/2024 19:12     ASSESSMENT AND PLAN: This is a very pleasant 63 years old white female with stage IIIa (T2a, N2, M0) non-small cell lung cancer, with unknown subtype secondary to insufficient tissue presented with left upper lobe lung mass in addition to left hilar and AP window lymphadenopathy diagnosed in October 2024.  She underwent a course of concurrent chemoradiation with weekly carboplatin  for AUC of 2 and paclitaxel  45 Mg/M2 status post 6 cycles of treatment.  The patient has been tolerating this treatment fairly well. She is currently on consolidation treatment with immunotherapy with Imfinzi  1500 Mg IV every 4 weeks status post 10 cycles.  She has been tolerating this treatment well with no concerning adverse effects.  She had a recent CT scan of the chest followed by a PET scan that showed suspicious disease progression versus inflammatory process in the left upper lobe. I recommended for the patient to discontinue her treatment at this point. Assessment and Plan Assessment & Plan Stage 3A non-small cell lung cancer, left upper lobe, status post chemoradiation and consolidation durvalumab , with new progressive left upper lobe consolidation suspicious for recurrence versus pneumonitis/pneumonia Progressive consolidation in the left upper lobe on recent imaging, suspicious for recurrent tumor versus pneumonitis/pneumonia. PET scan inconclusive, indicating possible cancer, scar, or inflammation. Decision to halt immune therapy and initiate high-dose steroids to address potential inflammation. Antibiotics prescribed to cover possible infection. Plan to reassess with a scan in six  weeks to determine if inflammation improves or if cancer progression is suspected. - Discontinued durvalumab . - Prescribed prednisone: 70 mg first week, 50 mg second week, 30 mg third week, 10 mg fourth week. - Prescribed doxycycline  for two weeks, twice daily. - Scheduled follow-up scan in  six weeks. She was advised to call immediately if she has any other concerning symptoms in the interval.  The patient voices understanding of current disease status and treatment options and is in agreement with the current care plan. The total time spent in the appointment was 30 minutes including review of chart and various tests results, discussions about plan of care and coordination of care plan .  All questions were answered. The patient knows to call the clinic with any problems, questions or concerns. We can certainly see the patient much sooner if necessary.  Disclaimer: This note was dictated with voice recognition software. Similar sounding words can inadvertently be transcribed and may not be corrected upon review.

## 2024-04-20 ENCOUNTER — Other Ambulatory Visit: Payer: Self-pay

## 2024-04-20 ENCOUNTER — Ambulatory Visit: Payer: Self-pay | Admitting: Physician Assistant

## 2024-04-20 ENCOUNTER — Ambulatory Visit: Payer: Self-pay

## 2024-04-26 ENCOUNTER — Ambulatory Visit (HOSPITAL_COMMUNITY)
Admission: RE | Admit: 2024-04-26 | Discharge: 2024-04-26 | Disposition: A | Discharge status: Home / Self Care | Payer: Self-pay | Source: Ambulatory Visit | Attending: Internal Medicine | Admitting: Internal Medicine

## 2024-04-26 ENCOUNTER — Encounter (HOSPITAL_COMMUNITY): Payer: Self-pay

## 2024-04-26 ENCOUNTER — Inpatient Hospital Stay: Payer: Self-pay | Attending: Internal Medicine

## 2024-04-26 DIAGNOSIS — C3412 Malignant neoplasm of upper lobe, left bronchus or lung: Secondary | ICD-10-CM | POA: Insufficient documentation

## 2024-04-26 DIAGNOSIS — Z923 Personal history of irradiation: Secondary | ICD-10-CM | POA: Insufficient documentation

## 2024-04-26 DIAGNOSIS — F17210 Nicotine dependence, cigarettes, uncomplicated: Secondary | ICD-10-CM | POA: Insufficient documentation

## 2024-04-26 DIAGNOSIS — C3490 Malignant neoplasm of unspecified part of unspecified bronchus or lung: Secondary | ICD-10-CM | POA: Insufficient documentation

## 2024-04-26 DIAGNOSIS — Z79899 Other long term (current) drug therapy: Secondary | ICD-10-CM | POA: Insufficient documentation

## 2024-04-26 LAB — CBC WITH DIFFERENTIAL (CANCER CENTER ONLY)
Abs Immature Granulocytes: 0.04 K/uL (ref 0.00–0.07)
Basophils Absolute: 0 K/uL (ref 0.0–0.1)
Basophils Relative: 1 %
Eosinophils Absolute: 0.1 K/uL (ref 0.0–0.5)
Eosinophils Relative: 2 %
HCT: 40.2 % (ref 36.0–46.0)
Hemoglobin: 13.3 g/dL (ref 12.0–15.0)
Immature Granulocytes: 1 %
Lymphocytes Relative: 20 %
Lymphs Abs: 1 K/uL (ref 0.7–4.0)
MCH: 29.1 pg (ref 26.0–34.0)
MCHC: 33.1 g/dL (ref 30.0–36.0)
MCV: 88 fL (ref 80.0–100.0)
Monocytes Absolute: 0.4 K/uL (ref 0.1–1.0)
Monocytes Relative: 8 %
Neutro Abs: 3.5 K/uL (ref 1.7–7.7)
Neutrophils Relative %: 68 %
Platelet Count: 275 K/uL (ref 150–400)
RBC: 4.57 MIL/uL (ref 3.87–5.11)
RDW: 16.4 % — ABNORMAL HIGH (ref 11.5–15.5)
WBC Count: 5 K/uL (ref 4.0–10.5)
nRBC: 0 % (ref 0.0–0.2)

## 2024-04-26 LAB — CMP (CANCER CENTER ONLY)
ALT: 18 U/L (ref 0–44)
AST: 14 U/L — ABNORMAL LOW (ref 15–41)
Albumin: 4.1 g/dL (ref 3.5–5.0)
Alkaline Phosphatase: 79 U/L (ref 38–126)
Anion gap: 9 (ref 5–15)
BUN: 9 mg/dL (ref 8–23)
CO2: 30 mmol/L (ref 22–32)
Calcium: 9.2 mg/dL (ref 8.9–10.3)
Chloride: 99 mmol/L (ref 98–111)
Creatinine: 0.83 mg/dL (ref 0.44–1.00)
GFR, Estimated: >60 mL/min
Glucose, Bld: 112 mg/dL — ABNORMAL HIGH (ref 70–99)
Potassium: 3.7 mmol/L (ref 3.5–5.1)
Sodium: 138 mmol/L (ref 135–145)
Total Bilirubin: 0.5 mg/dL (ref 0.0–1.2)
Total Protein: 6.8 g/dL (ref 6.5–8.1)

## 2024-04-26 MED ORDER — IOHEXOL 300 MG/ML  SOLN
75.0000 mL | Freq: Once | INTRAMUSCULAR | Status: AC | PRN
  Administered 2024-04-26: 75 mL via INTRAVENOUS

## 2024-05-03 ENCOUNTER — Inpatient Hospital Stay: Payer: Self-pay | Admitting: Internal Medicine

## 2024-05-03 VITALS — HR 96 | Temp 97.7°F | Resp 17 | Ht 63.0 in | Wt 159.9 lb

## 2024-05-03 DIAGNOSIS — C3490 Malignant neoplasm of unspecified part of unspecified bronchus or lung: Secondary | ICD-10-CM

## 2024-05-03 NOTE — Progress Notes (Signed)
 "     [facility] [phone]) [phone]   [phone]  OFFICE PROGRESS NOTE  Patient, No Pcp Per No address on file  DIAGNOSIS: stage IIIa (T2a, N2, M0) non-small cell lung cancer, with unknown subtype secondary to insufficient tissue presented with left upper lobe lung mass in addition to left hilar and AP window lymphadenopathy diagnosed in October 2024.    PRIOR THERAPY:  1) Concurrent chemoradiation with carboplatin  for AUC of 2 and paclitaxel  45 mg/m2 chemotherapy. Status post 6 cycles. First dose on 03/30/23. Status post 6  cycles.  Last dose was given May 05, 2023. 2) Consolidation treatment with immunotherapy with Imfinzi  1500 Mg IV every 4 weeks.  First dose June 17, 2023.  Status post 10 cycles.  Last dose was given February 24, 2024.  This treatment was discontinued secondary to suspicious immunotherapy mediated pneumonitis.   CURRENT THERAPY: Observation.  INTERVAL HISTORY: Rowene Suto 63 y.o. female returns to the clinic today for follow-up visit. Discussed the use of AI scribe software for clinical note transcription with the patient, who gave verbal consent to proceed.  History of Present Illness Orena Cavazos is a 63 year old female with stage 3A non-small cell lung cancer who presents for follow-up and restaging after recent chest CT.  She was diagnosed with stage 3A non-small cell lung cancer in October 2024 and completed concurrent chemoradiation followed by nine cycles of durvalumab , which was discontinued in October 2025 due to immunotherapy-induced pneumonitis. She is currently under observation with serial imaging and is not receiving active oncologic therapy.  At this visit, she presents for evaluation following a recent chest CT performed for restaging. She accessed her scan results and reports that the findings showed a slight decrease in tumor size. She remains asymptomatic, with no pain, cough, or dyspnea, and has not experienced any new  or acute symptoms since her last visit.  She reports increased somnolence, which she attributes to cessation of corticosteroids previously used for pneumonitis management. While on steroids, she experienced increased energy and activity, as noted by her husband. She is no longer taking steroids and has no ongoing respiratory complaints.    MEDICAL HISTORY: Past Medical History:  Diagnosis Date   Allergy    Cancer (HCC) 02/2023   lung   COPD (chronic obstructive pulmonary disease) (HCC)    History of radiation therapy    Left Lung SBRT- 03/30/23-05/14/23- Dr. Lynwood Nasuti   Lung nodule 09/2022   hypermetabolic left upper lobe    ALLERGIES:  is allergic to wixela inhub [fluticasone-salmeterol].  MEDICATIONS:  Current Outpatient Medications  Medication Sig Dispense Refill   albuterol (ACCUNEB) 0.63 MG/3ML nebulizer solution Take 1 ampule by nebulization every 6 (six) hours as needed for wheezing.     albuterol (VENTOLIN HFA) 108 (90 Base) MCG/ACT inhaler Inhale into the lungs every 6 (six) hours as needed for wheezing or shortness of breath. Using at night     azithromycin  (ZITHROMAX  Z-PAK) 250 MG tablet Take as directed (Patient not taking: Reported on 02/26/2024) 6 each 0   benzonatate  (TESSALON ) 100 MG capsule Take 1 capsule (100 mg total) by mouth 3 (three) times daily as needed. 30 capsule 2   doxycycline  (VIBRA -TABS) 100 MG tablet Take 1 tablet (100 mg total) by mouth 2 (two) times daily. (Patient not taking: Reported on 02/26/2024) 14 tablet 0   doxycycline  (VIBRA -TABS) 100 MG tablet Take 1 tablet (100 mg total) by mouth 2 (two) times daily. 30 tablet 0   fexofenadine (ALLEGRA  ALLERGY) 180 MG tablet Take 180 mg by mouth as needed.     HYDROcodone  bit-homatropine (HYCODAN) 5-1.5 MG/5ML syrup Take 5 mLs by mouth every 6 (six) hours as needed for cough. (Patient not taking: Reported on 02/26/2024) 120 mL 0   ibuprofen (ADVIL) 200 MG tablet Take 200 mg by mouth every 4 (four) hours as  needed for mild pain (pain score 1-3).     methylPREDNISolone  (MEDROL  DOSEPAK) 4 MG TBPK tablet Use as instructed (Patient not taking: Reported on 02/26/2024) 21 tablet 0   mirtazapine  (REMERON ) 15 MG tablet Take 1 tablet (15 mg total) by mouth at bedtime. 30 tablet 2   Multiple Vitamins-Minerals (MULTIVITAMINS THER. W/MINERALS) TABS tablet Take 1 tablet by mouth daily.     ondansetron  (ZOFRAN -ODT) 4 MG disintegrating tablet Take 1 tablet (4 mg total) by mouth every 8 (eight) hours as needed for nausea or vomiting. 20 tablet 0   potassium chloride  SA (KLOR-CON  M) 20 MEQ tablet Take 1 tablet (20 mEq total) by mouth daily. 7 tablet 0   predniSONE  (DELTASONE ) 10 MG tablet 7 tablet p.o. daily for 1 week followed by 5 tablet p.o. daily for 1 week followed by 3 tablet p.o. daily for 1 week followed by 1 tablet p.o. daily for 1 week then stop 112 tablet 0   promethazine -dextromethorphan (PROMETHAZINE -DM) 6.25-15 MG/5ML syrup Take 5 mLs by mouth 4 (four) times daily as needed for cough. 118 mL 0   No current facility-administered medications for this visit.    SURGICAL HISTORY:  Past Surgical History:  Procedure Laterality Date   BRONCHIAL NEEDLE ASPIRATION BIOPSY  03/02/2023   Procedure: BRONCHIAL NEEDLE ASPIRATION BIOPSIES;  Surgeon: Brenna Adine CROME, DO;  Location: [facility];  Service: Pulmonary;;   KNEE SURGERY Left 2017   knee repair with hardware   VIDEO BRONCHOSCOPY WITH ENDOBRONCHIAL ULTRASOUND Left 03/02/2023   Procedure: VIDEO BRONCHOSCOPY WITH ENDOBRONCHIAL ULTRASOUND;  Surgeon: Brenna Adine CROME, DO;  Location: [facility];  Service: Pulmonary;  Laterality: Left;    REVIEW OF SYSTEMS:  Constitutional: positive for fatigue Eyes: negative Ears, nose, mouth, throat, and face: negative Respiratory: positive for dyspnea on exertion Cardiovascular: negative Gastrointestinal: negative Genitourinary:negative Integument/breast: negative Hematologic/lymphatic:  negative Musculoskeletal:negative Neurological: negative Behavioral/Psych: negative Endocrine: negative Allergic/Immunologic: negative   PHYSICAL EXAMINATION: General appearance: alert, cooperative, fatigued, and no distress Head: Normocephalic, without obvious abnormality, atraumatic Neck: no adenopathy, no JVD, supple, symmetrical, trachea midline, and thyroid  not enlarged, symmetric, no tenderness/mass/nodules Lymph nodes: Cervical, supraclavicular, and axillary nodes normal. Resp: clear to auscultation bilaterally Back: symmetric, no curvature. ROM normal. No CVA tenderness. Cardio: regular rate and rhythm, S1, S2 normal, no murmur, click, rub or gallop GI: soft, non-tender; bowel sounds normal; no masses,  no organomegaly Extremities: extremities normal, atraumatic, no cyanosis or edema Neurologic: Alert and oriented X 3, normal strength and tone. Normal symmetric reflexes. Normal coordination and gait  ECOG PERFORMANCE STATUS: 1 - Symptomatic but completely ambulatory  Pulse 96, temperature 97.7 F (36.5 C), temperature source Temporal, resp. rate 17, height 5' 3 (1.6 m), weight 159 lb 14.4 oz (72.5 kg), SpO2 97%.  LABORATORY DATA: Lab Results  Component Value Date   WBC 5.0 04/26/2024   HGB 13.3 04/26/2024   HCT 40.2 04/26/2024   MCV 88.0 04/26/2024   PLT 275 04/26/2024      Chemistry      Component Value Date/Time   NA 138 04/26/2024 1058   K 3.7 04/26/2024 1058   CL 99 04/26/2024 1058  CO2 30 04/26/2024 1058   BUN 9 04/26/2024 1058   CREATININE 0.83 04/26/2024 1058      Component Value Date/Time   CALCIUM 9.2 04/26/2024 1058   ALKPHOS 79 04/26/2024 1058   AST 14 (L) 04/26/2024 1058   ALT 18 04/26/2024 1058   BILITOT 0.5 04/26/2024 1058       RADIOGRAPHIC STUDIES: CT Chest W Contrast Result Date: 04/26/2024 CLINICAL DATA:  Non-small cell lung cancer (NSCLC), staging. * Tracking Code: BO * EXAM: CT CHEST WITH CONTRAST TECHNIQUE: Multidetector CT  imaging of the chest was performed during intravenous contrast administration. RADIATION DOSE REDUCTION: This exam was performed according to the departmental dose-optimization program which includes automated exposure control, adjustment of the mA and/or kV according to patient size and/or use of iterative reconstruction technique. CONTRAST:  75mL OMNIPAQUE  IOHEXOL  300 MG/ML  SOLN COMPARISON:  Nuclear medicine PET scan from 03/02/2024 and CT scan chest from 02/15/2024. FINDINGS: Cardiovascular: Normal cardiac size. Mild stable pericardial effusion. No aortic aneurysm. There are coronary artery calcifications, in keeping with coronary artery disease. There are also mild peripheral atherosclerotic vascular calcifications of thoracic aorta and its major branches. Mediastinum/Nodes: Visualized thyroid  gland appears grossly unremarkable. No solid / cystic mediastinal masses. The esophagus is nondistended precluding optimal assessment. No axillary, mediastinal or hilar lymphadenopathy by size criteria. Lungs/Pleura: The central tracheo-bronchial tree is patent. Redemonstration of mild narrowing of the left upper lobe bronchus, due to left hilar mass essentially similar to prior study. Redemonstration of irregular heterogeneous left hilar opacity which appears slightly decreased in size since the prior study. There are associated peripheral irregular opacities in left upper lobe, which also appear improved since the prior study. No new mass or consolidation. No pleural effusion or pneumothorax. No suspicious lung nodules. Upper Abdomen: Visualized upper abdominal viscera within normal limits. Musculoskeletal: The visualized soft tissues of the chest wall are grossly unremarkable. No suspicious osseous lesions. There are mild multilevel degenerative changes in the visualized spine. IMPRESSION: 1. Redemonstration of irregular heterogeneous left hilar opacity which appears slightly decreased in size since the prior study.  There are associated peripheral irregular opacities in left upper lobe, which also appear improved since the prior study. No new mass or consolidation. No suspicious lung nodules. 2. Multiple other nonacute observations, as described above. Aortic Atherosclerosis (ICD10-I70.0). Electronically Signed   By: Ree Molt M.D.   On: 04/26/2024 14:11     ASSESSMENT AND PLAN: This is a very pleasant 63 years old white female with stage IIIa (T2a, N2, M0) non-small cell lung cancer, with unknown subtype secondary to insufficient tissue presented with left upper lobe lung mass in addition to left hilar and AP window lymphadenopathy diagnosed in October 2024.  She underwent a course of concurrent chemoradiation with weekly carboplatin  for AUC of 2 and paclitaxel  45 Mg/M2 status post 6 cycles of treatment.  The patient has been tolerating this treatment fairly well. She is currently on consolidation treatment with immunotherapy with Imfinzi  1500 Mg IV every 4 weeks status post 10 cycles.  She has been tolerating this treatment well with no concerning adverse effects.  Her treatment was discontinued secondary to highly suspicious immunotherapy mediated pneumonitis. She is currently on observation. The patient had repeat CT scan of the chest performed recently.  I personally independently reviewed the scan images and discussed the result with the patient today.  Her scan showed no concerning findings for disease progression and there was slight decrease in the size of the heterogeneous left hilar opacity.  Assessment and Plan Assessment & Plan Stage 3A non-small cell lung cancer She is under observation following completion of concurrent chemoradiation and nine cycles of consolidation durvalumab , which was discontinued due to suspected pneumonitis. Recent chest CT demonstrates slight decrease in tumor size. - Reviewed recent chest CT showing slight decrease in tumor size. - Recommended continued observation. -  Planned repeat chest CT in three months for restaging. - Scheduled follow-up visit in three months.  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 She is currently sleeping more. Immunotherapy will not be resumed unless disease progression or recurrence occurs. - Confirmed immunotherapy remains discontinued. - Discussed that immunotherapy will not be resumed unless disease progression or recurrence occurs.  Nicotine dependence She continues to use cigarettes. - Provided counseling regarding smoking cessation.  The patient was advised to call immediately if she has any other concerning symptoms in the interval. The patient voices understanding of current disease status and treatment options and is in agreement with the current care plan. The total time spent in the appointment was 30 minutes including review of chart and various tests results, discussions about plan of care and coordination of care plan .  All questions were answered. The patient knows to call the clinic with any problems, questions or concerns. We can certainly see the patient much sooner if necessary.  Disclaimer: This note was dictated with voice recognition software. Similar sounding words can inadvertently be transcribed and may not be corrected upon review.        "

## 2024-05-23 ENCOUNTER — Other Ambulatory Visit: Payer: Self-pay | Admitting: Medical Oncology

## 2024-05-23 ENCOUNTER — Telehealth: Payer: Self-pay | Admitting: Medical Oncology

## 2024-05-23 NOTE — Telephone Encounter (Signed)
 Patient reports that for the past 2 weeks, she has noticed a decreased appetite and energy, stating, I am not hungry and I love to eat. I do not have an appetite or energy. I am sleeping a lot.   Patient reports cough has returned with production of thick mucus. Patient is currently taking Mucinex.  Dietary intake over the weekend included steak, a cereal bar, and [employer].  Patient denies nausea, vomiting, diarrhea, constipation, sore throat, fever, or runny nose.   I encouraged her to continue to eat and drink fluids and to monitor symptoms for now.

## 2024-05-24 ENCOUNTER — Telehealth: Payer: Self-pay | Admitting: Medical Oncology

## 2024-05-24 NOTE — Telephone Encounter (Signed)
 F/u symptoms- I am still not able to eat even though I am hungry. I was able to eat a hamburger patty today.  She is drinking water. She reports that her stomach is rumbling and she had an episode of diarrhea yesterday . I instructed her to go to ED for evaluation. She said  I will go tomorrow.

## 2024-05-25 ENCOUNTER — Inpatient Hospital Stay: Payer: Self-pay | Admitting: Nurse Practitioner

## 2024-05-25 ENCOUNTER — Inpatient Hospital Stay: Payer: Self-pay

## 2024-05-25 ENCOUNTER — Other Ambulatory Visit: Payer: Self-pay | Admitting: Nurse Practitioner

## 2024-05-25 ENCOUNTER — Telehealth: Payer: Self-pay | Admitting: *Deleted

## 2024-05-25 ENCOUNTER — Encounter: Payer: Self-pay | Admitting: Nurse Practitioner

## 2024-05-25 ENCOUNTER — Other Ambulatory Visit: Payer: Self-pay | Admitting: *Deleted

## 2024-05-25 ENCOUNTER — Inpatient Hospital Stay: Payer: Self-pay | Attending: Internal Medicine

## 2024-05-25 DIAGNOSIS — Z79899 Other long term (current) drug therapy: Secondary | ICD-10-CM | POA: Insufficient documentation

## 2024-05-25 DIAGNOSIS — C3490 Malignant neoplasm of unspecified part of unspecified bronchus or lung: Secondary | ICD-10-CM

## 2024-05-25 DIAGNOSIS — K117 Disturbances of salivary secretion: Secondary | ICD-10-CM | POA: Insufficient documentation

## 2024-05-25 DIAGNOSIS — E876 Hypokalemia: Secondary | ICD-10-CM | POA: Insufficient documentation

## 2024-05-25 DIAGNOSIS — C3412 Malignant neoplasm of upper lobe, left bronchus or lung: Secondary | ICD-10-CM | POA: Insufficient documentation

## 2024-05-25 DIAGNOSIS — M79622 Pain in left upper arm: Secondary | ICD-10-CM | POA: Insufficient documentation

## 2024-05-25 DIAGNOSIS — R5383 Other fatigue: Secondary | ICD-10-CM | POA: Insufficient documentation

## 2024-05-25 DIAGNOSIS — R197 Diarrhea, unspecified: Secondary | ICD-10-CM | POA: Insufficient documentation

## 2024-05-25 DIAGNOSIS — F17210 Nicotine dependence, cigarettes, uncomplicated: Secondary | ICD-10-CM | POA: Insufficient documentation

## 2024-05-25 DIAGNOSIS — Z79620 Long term (current) use of immunosuppressive biologic: Secondary | ICD-10-CM | POA: Insufficient documentation

## 2024-05-25 LAB — CMP (CANCER CENTER ONLY)
ALT: 10 U/L (ref 0–44)
AST: 19 U/L (ref 15–41)
Albumin: 3.8 g/dL (ref 3.5–5.0)
Alkaline Phosphatase: 59 U/L (ref 38–126)
Anion gap: 12 (ref 5–15)
BUN: 5 mg/dL — ABNORMAL LOW (ref 8–23)
CO2: 27 mmol/L (ref 22–32)
Calcium: 9.5 mg/dL (ref 8.9–10.3)
Chloride: 99 mmol/L (ref 98–111)
Creatinine: 0.69 mg/dL (ref 0.44–1.00)
GFR, Estimated: >60 mL/min
Glucose, Bld: 116 mg/dL — ABNORMAL HIGH (ref 70–99)
Potassium: 3.2 mmol/L — ABNORMAL LOW (ref 3.5–5.1)
Sodium: 138 mmol/L (ref 135–145)
Total Bilirubin: 0.5 mg/dL (ref 0.0–1.2)
Total Protein: 6.4 g/dL — ABNORMAL LOW (ref 6.5–8.1)

## 2024-05-25 LAB — CBC WITH DIFFERENTIAL (CANCER CENTER ONLY)
Abs Immature Granulocytes: 0.02 K/uL (ref 0.00–0.07)
Basophils Absolute: 0 K/uL (ref 0.0–0.1)
Basophils Relative: 1 %
Eosinophils Absolute: 0.1 K/uL (ref 0.0–0.5)
Eosinophils Relative: 3 %
HCT: 39 % (ref 36.0–46.0)
Hemoglobin: 13.2 g/dL (ref 12.0–15.0)
Immature Granulocytes: 1 %
Lymphocytes Relative: 30 %
Lymphs Abs: 1.2 K/uL (ref 0.7–4.0)
MCH: 28.8 pg (ref 26.0–34.0)
MCHC: 33.8 g/dL (ref 30.0–36.0)
MCV: 85.2 fL (ref 80.0–100.0)
Monocytes Absolute: 0.4 K/uL (ref 0.1–1.0)
Monocytes Relative: 11 %
Neutro Abs: 2.1 K/uL (ref 1.7–7.7)
Neutrophils Relative %: 54 %
Platelet Count: 246 K/uL (ref 150–400)
RBC: 4.58 MIL/uL (ref 3.87–5.11)
RDW: 15 % (ref 11.5–15.5)
WBC Count: 3.9 K/uL — ABNORMAL LOW (ref 4.0–10.5)
nRBC: 0 % (ref 0.0–0.2)

## 2024-05-25 LAB — URINALYSIS, COMPLETE (UACMP) WITH MICROSCOPIC
Bacteria, UA: NONE SEEN
Bilirubin Urine: NEGATIVE
Glucose, UA: NEGATIVE mg/dL
Ketones, ur: NEGATIVE mg/dL
Leukocytes,Ua: NEGATIVE
Nitrite: NEGATIVE
Protein, ur: NEGATIVE mg/dL
Specific Gravity, Urine: 1.017 (ref 1.005–1.030)
pH: 5 (ref 5.0–8.0)

## 2024-05-25 LAB — TSH: TSH: 1.42 u[IU]/mL (ref 0.350–4.500)

## 2024-05-25 LAB — MAGNESIUM: Magnesium: 1.6 mg/dL — ABNORMAL LOW (ref 1.7–2.4)

## 2024-05-25 MED ORDER — SODIUM CHLORIDE 0.9 % IV SOLN: Freq: Once | INTRAVENOUS | Status: AC

## 2024-05-25 MED ORDER — POTASSIUM CHLORIDE CRYS ER 20 MEQ PO TBCR: 20.0000 meq | EXTENDED_RELEASE_TABLET | Freq: Every day | ORAL | 0 refills | Status: AC

## 2024-05-25 MED ORDER — MAGNESIUM OXIDE -MG SUPPLEMENT 400 (240 MG) MG PO TABS: 400.0000 mg | ORAL_TABLET | Freq: Every day | ORAL | 0 refills | Status: AC

## 2024-05-25 NOTE — Telephone Encounter (Signed)
 Done in error.

## 2024-05-25 NOTE — Progress Notes (Signed)
 "     [facility]   Telephone:(336) [phone] [phone]    Patient Care Team: Patient, No Pcp Per as PCP - General (General Practice) Prentis Duwaine BROCKS, RN as Oncology Nurse Navigator   CHIEF COMPLAINT: [facility] for fatigue and diarrhea   CURRENT THERAPY: observation for lung cancer s/p chemoradiation and immunotherapy last dose 02/2024  INTERVAL HISTORY Mrs. Courser presents for symptom management visit due to not being able to eat for 2 weeks. She has an appetite and feels hungry but when she goes to eat, a dry mouth makes food explode in her mouth. She attempted to eat a cheeseburger but had to discard the bread. Able to eat to patty without difficulty. No dysphagia or n/v. She has had diarrhea with some formed/liquid stool but no more than 2 episodes her day. No recent med changes or antibiotics. Denies abodminal pain/bloating. Has vague pain in RUQ and left axilla she attributes to sleeping with her elbows flexed under her body while prone on the couch. She has been tired since her last visit here. Denies fever/chills or sick exposures. Cough is stable, no new chest pain, dyspnea, hemoptysis, or other changes.    ROS  All other systems reviewed and negative   Past Medical History:  Diagnosis Date   Allergy    Cancer (HCC) 02/2023   lung   COPD (chronic obstructive pulmonary disease) (HCC)    History of radiation therapy    Left Lung SBRT- 03/30/23-05/14/23- Dr. Lynwood Nasuti   Lung nodule 09/2022   hypermetabolic left upper lobe     Past Surgical History:  Procedure Laterality Date   BRONCHIAL NEEDLE ASPIRATION BIOPSY  03/02/2023   Procedure: BRONCHIAL NEEDLE ASPIRATION BIOPSIES;  Surgeon: Brenna Adine CROME, DO;  Location: [facility];  Service: Pulmonary;;   KNEE SURGERY Left 2017   knee repair with hardware   VIDEO BRONCHOSCOPY WITH ENDOBRONCHIAL ULTRASOUND Left 03/02/2023   Procedure: VIDEO BRONCHOSCOPY WITH ENDOBRONCHIAL ULTRASOUND;  Surgeon: Brenna Adine CROME, DO;  Location: [facility];  Service: Pulmonary;  Laterality: Left;     Outpatient Encounter Medications as of 05/25/2024  Medication Sig   magnesium  oxide (MAG-OX) 400 (240 Mg) MG tablet Take 1 tablet (400 mg total) by mouth daily.   albuterol (ACCUNEB) 0.63 MG/3ML nebulizer solution Take 1 ampule by nebulization every 6 (six) hours as needed for wheezing.   albuterol (VENTOLIN HFA) 108 (90 Base) MCG/ACT inhaler Inhale into the lungs every 6 (six) hours as needed for wheezing or shortness of breath. Using at night   benzonatate  (TESSALON ) 100 MG capsule Take 1 capsule (100 mg total) by mouth 3 (three) times daily as needed.   fexofenadine (ALLEGRA ALLERGY) 180 MG tablet Take 180 mg by mouth as needed.   HYDROcodone  bit-homatropine (HYCODAN) 5-1.5 MG/5ML syrup Take 5 mLs by mouth every 6 (six) hours as needed for cough. (Patient not taking: Reported on 02/26/2024)   ibuprofen (ADVIL) 200 MG tablet Take 200 mg by mouth every 4 (four) hours as needed for mild pain (pain score 1-3).   mirtazapine  (REMERON ) 15 MG tablet Take 1 tablet (15 mg total) by mouth at bedtime.   Multiple Vitamins-Minerals (MULTIVITAMINS THER. W/MINERALS) TABS tablet Take 1 tablet by mouth daily.   ondansetron  (ZOFRAN -ODT) 4 MG disintegrating tablet Take 1 tablet (4 mg total) by mouth every 8 (eight) hours as needed for nausea or vomiting.   potassium chloride  SA (KLOR-CON  M) 20 MEQ tablet Take 1 tablet (20 mEq total) by mouth  daily.   [DISCONTINUED] azithromycin  (ZITHROMAX  Z-PAK) 250 MG tablet Take as directed (Patient not taking: Reported on 02/26/2024)   [DISCONTINUED] doxycycline  (VIBRA -TABS) 100 MG tablet Take 1 tablet (100 mg total) by mouth 2 (two) times daily. (Patient not taking: Reported on 02/26/2024)   [DISCONTINUED] doxycycline  (VIBRA -TABS) 100 MG tablet Take 1 tablet (100 mg total) by mouth 2 (two) times daily.   [DISCONTINUED] methylPREDNISolone  (MEDROL  DOSEPAK) 4 MG TBPK tablet Use as instructed  (Patient not taking: Reported on 02/26/2024)   [DISCONTINUED] potassium chloride  SA (KLOR-CON  M) 20 MEQ tablet Take 1 tablet (20 mEq total) by mouth daily.   [DISCONTINUED] predniSONE  (DELTASONE ) 10 MG tablet 7 tablet p.o. daily for 1 week followed by 5 tablet p.o. daily for 1 week followed by 3 tablet p.o. daily for 1 week followed by 1 tablet p.o. daily for 1 week then stop   No facility-administered encounter medications on file as of 05/25/2024.     Today's Vitals   05/25/24 1212 05/25/24 1214  BP: 104/70   Pulse: 94   Resp: 18   Temp: 98.4 F (36.9 C)   TempSrc: Tympanic   SpO2: 98%   Weight: 159 lb 8 oz (72.3 kg)   Height: 5' 3 (1.6 m)   PainSc:  2    Body mass index is 28.25 kg/m.    PHYSICAL EXAM GENERAL:alert, no distress and comfortable SKIN: no rash  HEENT; moist oral mucosa without thrush or ulcers; sclera clear NECK: without mass LUNGS: clear with normal breathing effort. Left axillary ttp without palpable adenopathy or mass HEART: regular rate & rhythm ABDOMEN: abdomen soft, non-tender and normal bowel sounds. No RUQ ttp NEURO: alert & oriented x 3 with fluent speech, no focal motor/sensory deficits   CBC    Latest Ref Rng & Units 05/25/2024   11:53 AM 04/26/2024   10:58 AM 03/23/2024    9:10 AM  CBC  WBC 4.0 - 10.5 K/uL 3.9  5.0  3.8   Hemoglobin 12.0 - 15.0 g/dL 86.7  86.6  87.8   Hematocrit 36.0 - 46.0 % 39.0  40.2  37.4   Platelets 150 - 400 K/uL 246  275  274       CMP     Latest Ref Rng & Units 05/25/2024   11:53 AM 04/26/2024   10:58 AM 03/23/2024    9:10 AM  CMP  Glucose 70 - 99 mg/dL 883  887  867   BUN 8 - 23 mg/dL 5  9  6    Creatinine 0.44 - 1.00 mg/dL 9.30  9.16  9.24   Sodium 135 - 145 mmol/L 138  138  139   Potassium 3.5 - 5.1 mmol/L 3.2  3.7  3.0   Chloride 98 - 111 mmol/L 99  99  101   CO2 22 - 32 mmol/L 27  30  26    Calcium 8.9 - 10.3 mg/dL 9.5  9.2  9.7   Total Protein 6.5 - 8.1 g/dL 6.4  6.8  6.5   Total Bilirubin 0.0 -  1.2 mg/dL 0.5  0.5  0.5   Alkaline Phos 38 - 126 U/L 59  79  78   AST 15 - 41 U/L 19  14  17    ALT 0 - 44 U/L 10  18  9        ASSESSMENT & PLAN: 64 year old female   Xerostomia  -Causing difficulty with mastication and eating. No associated n/v, dysphagia -No significant weight loss  Diarrhea  -Likely not related to chemo/immunotherapy given that it began >2 months after last dose  -No known sick contacts -Due to the stool being mixed formed/liquid, can't do cdif testing   Fatigue -Since completing immunotherapy  -Likely multifactorial from cancer treatment, nutrition/dehydration, electrolyte disturbance, vs other  Hypokalemia, hypomagnesemia  -Likely 2/2 diarrhea   RUQ, left axillary pain  -No adenopathy or palpable mass on exam -Possibly MSK from sleeping position  -Last CT chest 04/26/24 showed no axillary adenopathy, osseous lesions, or abnormality in the visualized liver   Non-small cell lung cancer, stage IIIa (T2a, N2, M0) -Presented with left upper lobe lung mass in addition to left hilar and AP window lymphadenopathy diagnosed in October 2024.  -Unknown subtype secondary to insufficient tissue  -S/p concurrent chemoradiation with carboplatin /paclitaxel  x6 cycles 03/30/23 -05/05/23 followed by consolidation immunotherapy with Imfinzi  06/17/23 -02/24/24.  This treatment was discontinued secondary to suspicious immunotherapy mediated pneumonitis. -CT chest 04/26/24 stable, currently on observation     [facility] PLAN: -Xerostomia: biotene mouth rinse and hydrate with meals, could see dentist -Diarrhea: begin imodium PRN per package insert, IVF today in [facility], c.dif testing if stool becomes completely liquid; refer to GI if this persists -Fatigue: hydrate, replete electrolytes, UA unremarkable; will check TSH/T4 -Low K/Mg: begin oral supplements po once daily x1 week then f/up with PCP (referral placed, will ask SW to help with resources) -Pain: could be MSK, no clear source on  recent CT, monitor  -NSCLC: Next f/up with Dr. Sherrod 3/30 with lab/CT a week prior    Orders Placed This Encounter  Procedures   TSH    Standing Status:   Future    Number of Occurrences:   1    Expiration Date:   05/25/2025   T4    Standing Status:   Future    Number of Occurrences:   1    Expiration Date:   05/25/2025   Ambulatory referral to Internal Medicine    Referral Priority:   Routine    Referral Type:   Consultation    Referral Reason:   Specialty Services Required    Requested Specialty:   Internal Medicine    Number of Visits Requested:   1      All questions were answered. The patient knows to call the clinic with any problems, questions or concerns. No barriers to learning were detected. I spent 30 minutes counseling the patient face to face. The total time spent in the appointment was 40 minutes and more than 50% was on counseling, review of test results, and coordination of care.   Vitoria Conyer K Gia Lusher, NP 05/25/2024  "

## 2024-05-26 ENCOUNTER — Ambulatory Visit: Payer: Self-pay | Admitting: Nurse Practitioner

## 2024-05-26 LAB — T4: T4, Total: 11 ug/dL (ref 4.5–12.0)

## 2024-05-26 LAB — URINE CULTURE: Culture: <10000 — AB

## 2024-05-30 ENCOUNTER — Inpatient Hospital Stay: Payer: Self-pay | Admitting: Licensed Clinical Social Worker

## 2024-05-30 DIAGNOSIS — C3490 Malignant neoplasm of unspecified part of unspecified bronchus or lung: Secondary | ICD-10-CM

## 2024-05-30 NOTE — Progress Notes (Signed)
 [facility] CSW Progress Note   Interventions: CSW received a message to contact pt regarding possible resources for K/Mg supplements.  VM left for pt directing her to the [facility] pharmacy for the least expensive option.  Referral sent to [facility] for a gas card to help offset the cost.        Follow Up Plan:  Patient will contact CSW with any support or resource needs    Devere JONELLE Manna, LCSW Clinical Social Worker [facility]

## 2024-07-25 ENCOUNTER — Inpatient Hospital Stay: Payer: Self-pay

## 2024-08-01 ENCOUNTER — Inpatient Hospital Stay: Payer: Self-pay | Admitting: Internal Medicine
